# Patient Record
Sex: Female | Born: 1955
Health system: Southern US, Community
[De-identification: ages and names within clinical notes are randomized; demographics above are authoritative.]

## PROBLEM LIST (undated history)

## (undated) DIAGNOSIS — N6320 Unspecified lump in the left breast, unspecified quadrant: Secondary | ICD-10-CM

## (undated) DIAGNOSIS — R011 Cardiac murmur, unspecified: Secondary | ICD-10-CM

## (undated) DIAGNOSIS — E785 Hyperlipidemia, unspecified: Secondary | ICD-10-CM

## (undated) HISTORY — PX: ROTATOR CUFF REPAIR: SHX139

## (undated) HISTORY — DX: Hyperlipidemia, unspecified: E78.5

## (undated) HISTORY — PX: APPENDECTOMY: SHX54

## (undated) HISTORY — PX: COLONOSCOPY: SHX174

---
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
## 1999-12-02 ENCOUNTER — Other Ambulatory Visit: Admission: RE | Admit: 1999-12-02 | Discharge: 1999-12-02 | Payer: Self-pay | Admitting: Family Medicine

## 2001-05-31 ENCOUNTER — Encounter: Payer: Self-pay | Admitting: Family Medicine

## 2001-05-31 ENCOUNTER — Encounter: Admission: RE | Admit: 2001-05-31 | Discharge: 2001-05-31 | Payer: Self-pay | Admitting: Family Medicine

## 2002-01-08 ENCOUNTER — Encounter: Payer: Self-pay | Admitting: Family Medicine

## 2002-01-08 ENCOUNTER — Encounter: Admission: RE | Admit: 2002-01-08 | Discharge: 2002-01-08 | Payer: Self-pay | Admitting: Family Medicine

## 2002-01-10 ENCOUNTER — Inpatient Hospital Stay (HOSPITAL_COMMUNITY): Admission: EM | Admit: 2002-01-10 | Discharge: 2002-01-15 | Payer: Self-pay | Admitting: *Deleted

## 2002-01-11 ENCOUNTER — Encounter: Payer: Self-pay | Admitting: Physical Medicine & Rehabilitation

## 2002-02-25 ENCOUNTER — Encounter: Payer: Self-pay | Admitting: Gastroenterology

## 2002-02-25 ENCOUNTER — Encounter: Admission: RE | Admit: 2002-02-25 | Discharge: 2002-02-25 | Payer: Self-pay | Admitting: Gastroenterology

## 2002-03-08 ENCOUNTER — Encounter: Admission: RE | Admit: 2002-03-08 | Discharge: 2002-03-08 | Payer: Self-pay | Admitting: Gastroenterology

## 2002-03-08 ENCOUNTER — Encounter: Payer: Self-pay | Admitting: Gastroenterology

## 2003-08-07 ENCOUNTER — Encounter: Admission: RE | Admit: 2003-08-07 | Discharge: 2003-08-07 | Payer: Self-pay | Admitting: Family Medicine

## 2004-10-18 ENCOUNTER — Encounter: Admission: RE | Admit: 2004-10-18 | Discharge: 2004-10-18 | Payer: Self-pay | Admitting: Family Medicine

## 2005-07-12 ENCOUNTER — Other Ambulatory Visit: Admission: RE | Admit: 2005-07-12 | Discharge: 2005-07-12 | Payer: Self-pay | Admitting: Family Medicine

## 2005-11-18 ENCOUNTER — Encounter: Admission: RE | Admit: 2005-11-18 | Discharge: 2005-11-18 | Payer: Self-pay | Admitting: Family Medicine

## 2006-11-20 ENCOUNTER — Encounter: Admission: RE | Admit: 2006-11-20 | Discharge: 2006-11-20 | Payer: Self-pay | Admitting: Family Medicine

## 2007-12-28 ENCOUNTER — Encounter: Admission: RE | Admit: 2007-12-28 | Discharge: 2007-12-28 | Payer: Self-pay | Admitting: Family Medicine

## 2009-01-09 ENCOUNTER — Encounter: Admission: RE | Admit: 2009-01-09 | Discharge: 2009-01-09 | Payer: Self-pay | Admitting: Family Medicine

## 2009-11-09 ENCOUNTER — Ambulatory Visit (HOSPITAL_COMMUNITY): Admission: RE | Admit: 2009-11-09 | Discharge: 2009-11-09 | Payer: Self-pay | Admitting: Surgery

## 2009-11-11 ENCOUNTER — Ambulatory Visit (HOSPITAL_COMMUNITY): Admission: RE | Admit: 2009-11-11 | Discharge: 2009-11-11 | Payer: Self-pay | Admitting: Surgery

## 2009-11-23 ENCOUNTER — Ambulatory Visit (HOSPITAL_BASED_OUTPATIENT_CLINIC_OR_DEPARTMENT_OTHER): Admission: RE | Admit: 2009-11-23 | Discharge: 2009-11-23 | Payer: Self-pay | Admitting: Surgery

## 2009-11-28 ENCOUNTER — Ambulatory Visit: Payer: Self-pay | Admitting: Internal Medicine

## 2009-12-08 ENCOUNTER — Encounter: Admission: RE | Admit: 2009-12-08 | Discharge: 2010-03-08 | Payer: Self-pay | Admitting: Surgery

## 2010-01-11 ENCOUNTER — Encounter: Admission: RE | Admit: 2010-01-11 | Discharge: 2010-01-11 | Payer: Self-pay | Admitting: Family Medicine

## 2010-01-18 ENCOUNTER — Ambulatory Visit (HOSPITAL_COMMUNITY): Admission: RE | Admit: 2010-01-18 | Discharge: 2010-01-19 | Payer: Self-pay | Admitting: Surgery

## 2010-01-18 HISTORY — PX: LAPAROSCOPIC GASTRIC BANDING: SHX1100

## 2010-03-17 ENCOUNTER — Encounter: Admission: RE | Admit: 2010-03-17 | Discharge: 2010-06-15 | Payer: Self-pay | Admitting: Surgery

## 2010-05-04 ENCOUNTER — Other Ambulatory Visit: Admission: RE | Admit: 2010-05-04 | Discharge: 2010-05-04 | Payer: Self-pay | Admitting: Family Medicine

## 2010-08-12 ENCOUNTER — Encounter
Admission: RE | Admit: 2010-08-12 | Discharge: 2010-08-12 | Payer: Self-pay | Source: Home / Self Care | Attending: Surgery | Admitting: Surgery

## 2010-12-14 LAB — DIFFERENTIAL
Basophils Relative: 1 % (ref 0–1)
Eosinophils Absolute: 0 10*3/uL (ref 0.0–0.7)
Eosinophils Absolute: 0 10*3/uL (ref 0.0–0.7)
Lymphocytes Relative: 18 % (ref 12–46)
Lymphocytes Relative: 38 % (ref 12–46)
Lymphs Abs: 1.2 10*3/uL (ref 0.7–4.0)
Lymphs Abs: 1.6 10*3/uL (ref 0.7–4.0)
Monocytes Relative: 10 % (ref 3–12)
Neutro Abs: 4.6 10*3/uL (ref 1.7–7.7)
Neutrophils Relative %: 73 % (ref 43–77)

## 2010-12-14 LAB — GLUCOSE, CAPILLARY: Glucose-Capillary: 124 mg/dL — ABNORMAL HIGH (ref 70–99)

## 2010-12-14 LAB — COMPREHENSIVE METABOLIC PANEL
AST: 32 U/L (ref 0–37)
Albumin: 4.3 g/dL (ref 3.5–5.2)
Alkaline Phosphatase: 61 U/L (ref 39–117)
Chloride: 105 mEq/L (ref 96–112)
Creatinine, Ser: 0.79 mg/dL (ref 0.4–1.2)
GFR calc Af Amer: >60 mL/min (ref >60)
GFR calc non Af Amer: >60 mL/min (ref >60)
Glucose, Bld: 160 mg/dL — ABNORMAL HIGH (ref 70–99)
Total Protein: 7.4 g/dL (ref 6.0–8.3)

## 2010-12-14 LAB — CBC
Platelets: 255 10*3/uL (ref 150–400)
RBC: 3.57 MIL/uL — ABNORMAL LOW (ref 3.87–5.11)
RDW: 13.1 % (ref 11.5–15.5)
RDW: 13.1 % (ref 11.5–15.5)
WBC: 4.3 10*3/uL (ref 4.0–10.5)
WBC: 6.3 10*3/uL (ref 4.0–10.5)

## 2010-12-14 LAB — HEMOGLOBIN AND HEMATOCRIT, BLOOD
HCT: 33.8 % — ABNORMAL LOW (ref 36.0–46.0)
Hemoglobin: 10.8 g/dL — ABNORMAL LOW (ref 12.0–15.0)

## 2010-12-16 ENCOUNTER — Other Ambulatory Visit: Payer: Self-pay | Admitting: Family Medicine

## 2010-12-16 DIAGNOSIS — Z1231 Encounter for screening mammogram for malignant neoplasm of breast: Secondary | ICD-10-CM

## 2011-01-12 ENCOUNTER — Encounter: Admit: 2011-01-12 | Payer: Self-pay | Admitting: Surgery

## 2011-01-12 ENCOUNTER — Ambulatory Visit: Payer: Self-pay | Admitting: *Deleted

## 2011-01-13 ENCOUNTER — Ambulatory Visit
Admission: RE | Admit: 2011-01-13 | Discharge: 2011-01-13 | Disposition: A | Discharge status: Home / Self Care | Payer: BC Managed Care – PPO | Source: Ambulatory Visit | Attending: Family Medicine | Admitting: Family Medicine

## 2011-01-13 DIAGNOSIS — Z1231 Encounter for screening mammogram for malignant neoplasm of breast: Secondary | ICD-10-CM

## 2011-02-11 NOTE — Discharge Summary (Signed)
[facility]  Patient:    Kendra Jennings, Kendra Jennings Visit Number: [id number] MRN: [id number]          Service Type: MED Location: [phone] Attending Physician:  Madelyn Flavors Dictated by:   Beather Arbour Thomasena Edis, M.D. Admit Date:  01/10/2002 Discharge Date: 01/15/2002   CC:         Florencia Reasons, M.D.  Jethro Bastos, M.D.   Discharge Summary  CURRENT HISTORY:  The patient is a 55 year old, gravida 0, African-American female who presented to [facility] complaining of left lower quadrant pain. She was subsequently sent to the emergency room for evaluation after seeing Dr. Shaune Pollack on the afternoon of April 17 with findings of increased abdominal pain. She did have a history of an appendectomy for a ruptured appendix at age 77.  HOSPITAL COURSE:  The patient was admitted for evaluation. She subsequently had a GI evaluation by Dr. Bernette Redbird. She was started on antibiotic ciprofloxacin and metronidazole for her pain. She did have a pelvic ultrasound which was negative and an abdominopelvic CT which was in essence negative as well. She states that years ago she did have antibiotics for an "intestinal infection" similar to her current symptoms. Her pelvic CT did show the uterus to be approximately 12 weeks in size consistent with fibroids. On evaluation prior to discharge, her uterus was found to be nontender and she had no right or left adnexal tenderness. The case was discussed with Dr. Matthias Hughs who agrees with me that the diagnosis is unclear. The patient showed improvement and was subsequently discharged home on metronidazole and ciprofloxacin on April 22. Of note, the patient told me that she has never had intercourse and thus it is highly doubtful that she would have a pelvic inflammatory disease in view of no intercourse. The patient was asked to follow-up with Dr. Matthias Hughs on Tuesday, May 27 at 9 a.m. and to call our  office for an appointment for 2-3 weeks. The patient was also instructed to resume her diabetes medications including Amaryl and Actos and her PPI Aciphex as well as her Lotensin and baby aspirin. Dictated by:   Beather Arbour Thomasena Edis, M.D. Attending Physician:  Madelyn Flavors DD:  01/19/02 TD:  01/21/02 Job: [id number]

## 2011-02-11 NOTE — Consult Note (Signed)
[facility]  Jennings:    Kendra Jennings, Kendra Jennings Visit Number: [id number] MRN: [id number]          Service Type: MED Location: [phone] Attending Physician:  Kendra Jennings Dictated by:   Kendra Jennings, M.D. Proc. Date: 01/12/02 Admit Date:  01/10/2002   CC:         Kendra Jennings, M.D.  Kendra Jennings, M.D.   Consultation Report  HISTORY OF PRESENT ILLNESS:  Dr. Eda Jennings, covering for Dr. Artist Jennings, asked me to see this 55 year old African-American female because of persistent lower abdominal pain.  Kendra Jennings is followed primarily by Dr. Woodfin Jennings at [facility] and, approximately five days ago, developed pain in the lower abdominal region which was fairly sharp and twisting in character, not associated with any change in bowel habits.  She was seen by Dr. Dorothe Jennings and a pelvic ultrasound was obtained showing apparently uterine fibroids.  Because of persistent symptoms and perhaps worsening of symptoms, she was seen subsequently by Dr. Kevan Jennings, referred to Dr. Artist Jennings, and ended up being admitted by Dr. Melba Jennings here at [facility] two days ago.  The Jennings apparently had a temperature greater than 100 degrees when she was initially seen at [facility] although in the emergency room on presentation here two days ago, the temperature was 99.4.  The Jennings also developed nausea and vomiting a day or two after the onset of symptoms, although those symptoms have fairly well diminished.  An abdominal pelvic CT was obtained which raised the question of fibroid, perhaps a degenerating fibroid, in the uterus and I have reviewed that CT with the radiologist.  Of note, the Jennings had marked uterine tenderness to examination when the Jennings was admitted by Dr. Randell Jennings two days ago.  The CT scan was carefully evaluated for any evidence of diverticular change or colonic inflammation or pericolonic  inflammation, diverticulitis, inflammatory bowel disease of the colon, etcetera, but there were no findings to suggest any of those diagnoses.  The Jennings is anemic but her Hemoccult status is not currently known.  She has not noticed any rectal bleeding and again has not had any diarrhea in association with the current problem.  The Jennings does not note any clear connection between consumption of food and exacerbation of these symptoms.  She really has not eaten much for the past five days and is currently on a clear liquid diet.  The Jennings has not really had pain similar to this before.  There is no family history of inflammatory bowel disease or other GI tract illnesses.  It has been about two or three days since the patients last bowel movement.  The Jennings has been consistently afebrile and with normal white count since admission.  ALLERGIES:  No known allergies.  CURRENT MEDICATIONS:  Amaryl, Actos, daily baby aspirin, Aciphex, and Lotensin.  PAST SURGICAL HISTORY: 1. Appendectomy at age 43. 2. D&C for menorrhagia about 20 years ago. 3. Exploratory surgery on her neck about 15 years ago.  MEDICAL ILLNESSES: 1. Seven year history of type 2 diabetes. 2. History of hypertension. 3. No chronic obstructive pulmonary disease. 4. No known coronary disease. 5. There is a history of gastroesophageal reflux disease controlled on    medication.  HABITS:  Nonsmoker, nondrinker.  FAMILY HISTORY:  Possible liver or kidney cancer in her mother but negative for colon cancer, inflammatory bowel disease or colitis, gallbladder trouble, liver disease, or ulcers.  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  She last worked part of Tuesday, about four days ago.  REVIEW OF SYSTEMS:  The Jennings has a history of acid reflux symptoms for which she is maintained on Aciphex with pretty  good control.  No problem with esophageal dysphagia symptoms, anorexia, involuntary weight loss, chronic nausea, vomiting or abdominal pain, constipation, diarrhea, or rectal bleeding.  PHYSICAL EXAMINATION:  GENERAL:  Kendra Jennings is an overweight, pleasant African-American female in perhaps mild discomfort or distress but certainly not in acute active distress.  HEENT:  She is anicteric and without conjunctival pallor.  CHEST:  Clear.  HEART:  Normal.  ABDOMEN:  Obese and rather firm, without discreet tenderness, mass effect, or palpable organomegaly.  There is no impressive tenderness and certainly there are peritoneal findings.  RECTAL:  Examination showed an empty rectal ampulla with mucoid residue which was applied to a guaiac card which is being sent down to the laboratory for analysis.  NEUROLOGIC:  She appears grossly intact, without obvious cranial nerve or focal motor deficits.  LABORATORY DATA:  Urine pregnancy test negative.  GC and Chlamydia probes and RPR are negative.  Wet prep is negative.  Hemoglobin A1c is 8.  Admission blood work pertinent for potassium of 3.1, glucose of 262, and completely normal liver chemistries with an albumin of 3.6.  White count 6500, hemoglobin 10.3, with an MCV of 79 and a normal RDW of 14.2.  CT SCAN:  Showed evidence for fatty infiltration of the liver and uterine fibroids, but no other significant or acute abnormalities were identified.  ADDENDUM:  The fecal occult blood test on the digital rectal exam specimen is now available and has come back negative.  IMPRESSION: 1. Low abdominal pain of unclear etiology, gynecologic versus    gastrointestinal. 2. Borderline microcytic anemia with normal RDW. 3. Recent nausea and vomiting. 4. Radiographic evidence for fatty infiltration of the liver, but with normal     liver chemistries. 5. Uterine fibroids. 6. Type 2 diabetes. 7. Gastroesophageal reflux disease, medically  controlled.  DISCUSSION:  The absence of intestinal symptoms such as diarrhea, the absence of radiographic abnormalities of intestinal structures in the left lower quadrant such as the sigmoid colon, and the absence of occult blood in the stool would all argue against this being a gastrointestinal process. Conceivably, this could be a very low grade localized diverticulitis, especially considering the fact that the Jennings started out with a low grade fever, and for that reason, I think the initiation of some empiric antibiotic therapy is reasonable.  I will also obtain iron studies and a sed rate.  If the Jennings is shown to be iron deficient, especially if subsequent stool guaiacs come back positive, we might consider colonoscopic or at least sigmoidoscopic evaluation but on balance, I think this will turn out to be something other than the gastrointestinal process accounting for the patients acute illness.  I appreciate the opportunity to have seen this Jennings in consultation with you and will continue to follow her with you for at least the next couple of days. Dictated by:   Kendra Jennings, M.D. Attending Physician:  Kendra Jennings DD:  01/12/02 TD:  01/12/02 Job: [id number]

## 2011-02-11 NOTE — H&P (Signed)
[facility]  Patient:    Kendra Jennings, Kendra Jennings Visit Number: [id number] MRN: [id number]          Service Type: MED Location: [phone] Attending Physician:  Madelyn Flavors Dictated by:   Almedia Balls Randell Patient, M.D. Admit Date:  01/10/2002   CC:         Lafonda Mosses B. Thomasena Edis, M.D.  Duncan Dull, M.D.   History and Physical  CHIEF COMPLAINT:  Pain.  HISTORY OF PRESENT ILLNESS:  The patient is a 55 year old gravida 0, para 0, whose last menstrual period was December 28, 2001.  She states that her menses have been regular and without problems.  She began having left lower quadrant pain on January 07, 2002, and presented to [facility] where she was seen by Dr. Dorothe Pea who obtained an ultrasound which showed uterine enlargement consistent with fibroid uterus and normal ovaries bilaterally. There was noted to be no paraovarian or cul-de-sac fluid at this time.  This was performed on January 08, 2002.  She states that over the past several days she has had increased nausea and vomiting and pain increasing on the afternoon of January 10, 2002.  She was evaluated again at [facility] by Dr. Shaune Pollack on the afternoon on January 10, 2002, with findings of increased abdominal pain.  The patients vital signs were otherwise normal.  CBC was obtained which showed white count of 8400 ______ with some shift in leukocytes with 72+ percent segmented neutrophils present.  Hemoglobin was 11.7, hematocrit 37.1, with MCV at 79, MCH 24.9, which are low.  CMET panel was performed as well which showed serum glucose of 326, and the patient is a non-insulin-dependent diabetic, being maintained on Amaryl and Actos.  The remainder of the CMET panel was normal.  She was referred to [facility], Dr. Artist Pais, for whom I am covering on this date.  She is admitted through the emergency room at this time for further evaluation.  PAST MEDICAL HISTORY:   Appendectomy for ruptured appendix at age 81.  No other surgery.  She states that she has not had sexual exposure in some time.  She denies any discharge, prior pelvic infection, GI, or GU problems.  ALLERGIES:  She is allergic to no medications.  MEDICATIONS:  Amaryl 2 mg a day for her diabetes, as well as Actos 30 mg a day.  She takes Aciphex for "GERD" 1 per day, and Lotensin 150 mg per day for hypertension.  She also takes one aspirin tablet per day.  REVIEW OF SYSTEMS:  HEENT:  Wears glasses.  CARDIORESPIRATORY:  Negative. GASTROINTESTINAL:  As noted in present illness.  GENITOURINARY:  As noted above.  NEUROMUSCULAR:  Negative.  PHYSICAL EXAMINATION:  VITAL SIGNS:  Blood pressure 148/92, pulse 96, respirations 18.  The patient is afebrile on admission.  GENERAL:  Well-developed black female in moderate distress.  HEENT:  Within normal limits.  NECK:  Supple.  Without masses, adenopathy, or bruits.  HEART:  Elevated heart rate which is tachycardic at times.  Without definite murmur present.  LUNGS:  Clear to P&A.  ABDOMEN:  Soft, with increased panniculus.  Tender in the left lower quadrant, without rebound.  Bowel sounds are somewhat diminished but are present.  PELVIC:  External genitalia, Bartholin, urethral, and Skenes glands within normal limits.  Cervix has very minimal mucopurulent discharge present.  The os is closed.  Bimanual exam reveals exquisite tenderness on palpation of the uterus which is enlarged  to approximately [redacted] weeks gestational size and is somewhat irregular.  There are no palpable adnexal masses, and both adnexa are only minimally tender when palpated deeply.  Anterior and posterior cul-de-sac exam is confirmatory.  EXTREMITIES:   Within normal limits.  CENTRAL NERVOUS SYSTEM:  Grossly intact.  SKIN:  Without suspicious lesions.  IMPRESSION:  Left lower quadrant to mid lower abdomen pain.  PLAN:  Admit for IVs and further  evaluation. Dictated by:   Almedia Balls Randell Patient, M.D. Attending Physician:  Madelyn Flavors DD:  01/10/02 TD:  01/11/02 Job: [phone]

## 2011-04-29 ENCOUNTER — Encounter (INDEPENDENT_AMBULATORY_CARE_PROVIDER_SITE_OTHER): Payer: Self-pay

## 2011-04-29 ENCOUNTER — Ambulatory Visit (INDEPENDENT_AMBULATORY_CARE_PROVIDER_SITE_OTHER): Payer: BC Managed Care – PPO | Admitting: Physician Assistant

## 2011-04-29 VITALS — BP 120/84 | Ht 59.0 in | Wt 153.0 lb

## 2011-04-29 DIAGNOSIS — Z4651 Encounter for fitting and adjustment of gastric lap band: Secondary | ICD-10-CM

## 2011-04-29 NOTE — Patient Instructions (Signed)
Take clear liquids for the next 48 hours. Thin protein shakes are ok to start on Saturday evening. Call us if you have persistent vomiting or regurgitation, night cough or reflux symptoms. Return as scheduled or sooner if you notice no changes in hunger/portion sizes.   

## 2011-04-29 NOTE — Progress Notes (Signed)
  HISTORY: Kendra Jennings is a 55 y.o.female who received an AP-Standard lap-band in April 2011 by Dr. Daphine Deutscher. She comes in complaining of persistent hunger and weight increase. She has been increasing her exercise but says her hunger and portions have gone up as well. No vomiting or regurgitation.  VITAL SIGNS: Filed Vitals:   04/29/11 1553  BP: 120/84    PHYSICAL EXAM: Physical exam reveals a very well-appearing 55 y.o.female in no apparent distress Neurologic: Awake, alert, oriented Psych: Bright affect, conversant Respiratory: Breathing even and unlabored. No stridor or wheezing Abdomen: Soft, nontender, nondistended to palpation. Incisions well-healed. No incisional hernias. Port easily palpated. Extremities: Atraumatic, good range of motion.  ASSESMENT: 55 y.o.  female  s/p AP-Standard lap-band.   PLAN: I accessed her port to determine total fill volume which was ca. 5.5 mL. I added 0.78mL to give a total volume of 6 mL after which she was able to swallow water without difficulty. We'll have her back in 3 months or sooner if necessary.

## 2011-08-26 ENCOUNTER — Encounter (INDEPENDENT_AMBULATORY_CARE_PROVIDER_SITE_OTHER): Payer: BC Managed Care – PPO

## 2011-11-24 ENCOUNTER — Encounter (INDEPENDENT_AMBULATORY_CARE_PROVIDER_SITE_OTHER): Payer: Self-pay

## 2011-11-24 ENCOUNTER — Ambulatory Visit (INDEPENDENT_AMBULATORY_CARE_PROVIDER_SITE_OTHER): Payer: BC Managed Care – PPO | Admitting: Physician Assistant

## 2011-11-24 VITALS — BP 114/86 | HR 68 | Temp 97.9°F | Resp 18 | Ht 59.0 in | Wt 151.2 lb

## 2011-11-24 DIAGNOSIS — Z4651 Encounter for fitting and adjustment of gastric lap band: Secondary | ICD-10-CM

## 2011-11-24 NOTE — Patient Instructions (Signed)
Take clear liquids tonight. Thin protein shakes are ok to start tomorrow morning. Slowly advance your diet thereafter. Call us if you have persistent vomiting or regurgitation, night cough or reflux symptoms. Return as scheduled or sooner if you notice no changes in hunger/portion sizes.  

## 2011-11-24 NOTE — Progress Notes (Signed)
  HISTORY: Kendra Jennings is a 56 y.o.female who received an AP-Standard lap-band in April 2011 by Dr. Daphine Deutscher. She comes in today with complaints of increased hunger and portion sizes. She denies regurgitation or reflux. She'd like a fill today.  VITAL SIGNS: Filed Vitals:   11/24/11 1126  BP: 114/86  Pulse: 68  Temp: 97.9 F (36.6 C)  Resp: 18    PHYSICAL EXAM: Physical exam reveals a very well-appearing 56 y.o.female in no apparent distress Neurologic: Awake, alert, oriented Psych: Bright affect, conversant Respiratory: Breathing even and unlabored. No stridor or wheezing Abdomen: Soft, nontender, nondistended to palpation. Incisions well-healed. No incisional hernias. Port easily palpated. Extremities: Atraumatic, good range of motion.  ASSESMENT: 56 y.o.  female  s/p AP-Standard lap-band.   PLAN: The patient's port was accessed with a 20G Huber needle without difficulty. Clear fluid was aspirated and 0.5 mL saline was added to the port to give a total predicted volume of 6.5 mL. The patient was able to swallow water without difficulty following the procedure and was instructed to take clear liquids for the next 24-48 hours and advance slowly as tolerated.

## 2011-12-06 ENCOUNTER — Other Ambulatory Visit: Payer: Self-pay | Admitting: Family Medicine

## 2011-12-06 DIAGNOSIS — Z1231 Encounter for screening mammogram for malignant neoplasm of breast: Secondary | ICD-10-CM

## 2011-12-22 ENCOUNTER — Encounter (INDEPENDENT_AMBULATORY_CARE_PROVIDER_SITE_OTHER): Payer: BC Managed Care – PPO

## 2012-01-16 ENCOUNTER — Ambulatory Visit
Admission: RE | Admit: 2012-01-16 | Discharge: 2012-01-16 | Disposition: A | Discharge status: Home / Self Care | Payer: BC Managed Care – PPO | Source: Ambulatory Visit | Attending: Family Medicine | Admitting: Family Medicine

## 2012-01-16 DIAGNOSIS — Z1231 Encounter for screening mammogram for malignant neoplasm of breast: Secondary | ICD-10-CM

## 2012-02-16 ENCOUNTER — Encounter (INDEPENDENT_AMBULATORY_CARE_PROVIDER_SITE_OTHER): Payer: BC Managed Care – PPO

## 2012-04-26 ENCOUNTER — Encounter (INDEPENDENT_AMBULATORY_CARE_PROVIDER_SITE_OTHER): Payer: Self-pay

## 2012-04-26 ENCOUNTER — Ambulatory Visit (INDEPENDENT_AMBULATORY_CARE_PROVIDER_SITE_OTHER): Payer: BC Managed Care – PPO | Admitting: Physician Assistant

## 2012-04-26 VITALS — BP 132/86 | HR 72 | Temp 98.4°F | Resp 14 | Ht 59.0 in | Wt 158.8 lb

## 2012-04-26 DIAGNOSIS — Z4651 Encounter for fitting and adjustment of gastric lap band: Secondary | ICD-10-CM

## 2012-04-26 NOTE — Patient Instructions (Signed)
Take clear liquids tonight. Thin protein shakes are ok to start tomorrow morning. Slowly advance your diet thereafter. Call us if you have persistent vomiting or regurgitation, night cough or reflux symptoms. Return as scheduled or sooner if you notice no changes in hunger/portion sizes.  

## 2012-04-26 NOTE — Progress Notes (Signed)
  HISTORY: Kendra Jennings is a 56 y.o.female who received an AP-Standard lap-band in April 2011 by Dr. Daphine Deutscher. She comes in with persistent hunger and large portion sizes despite a fill in February. She said she felt little change after that adjustment. She's consistently gaining weight despite an aggressive exercise program. She does admit to eating carbohydrates which may be contributing to her weight gain.  VITAL SIGNS: Filed Vitals:   04/26/12 1158  BP: 132/86  Pulse: 72  Temp: 98.4 F (36.9 C)  Resp: 14    PHYSICAL EXAM: Physical exam reveals a very well-appearing 56 y.o.female in no apparent distress Neurologic: Awake, alert, oriented Psych: Bright affect, conversant Respiratory: Breathing even and unlabored. No stridor or wheezing Abdomen: Soft, nontender, nondistended to palpation. Incisions well-healed. No incisional hernias. Port easily palpated. Extremities: Atraumatic, good range of motion.  ASSESMENT: 56 y.o.  female  s/p AP-Standard lap-band.   PLAN: The patient's port was accessed with a 20G Huber needle without difficulty. Clear fluid was aspirated and 1 mL saline was added to the port to give a total predicted volume of 7.5 mL. The patient was able to swallow water without difficulty following the procedure and was instructed to take clear liquids for the next 24-48 hours and advance slowly as tolerated.

## 2012-08-02 ENCOUNTER — Encounter (INDEPENDENT_AMBULATORY_CARE_PROVIDER_SITE_OTHER): Payer: BC Managed Care – PPO

## 2012-08-09 ENCOUNTER — Other Ambulatory Visit (INDEPENDENT_AMBULATORY_CARE_PROVIDER_SITE_OTHER): Payer: Self-pay | Admitting: Physician Assistant

## 2012-08-09 ENCOUNTER — Ambulatory Visit (INDEPENDENT_AMBULATORY_CARE_PROVIDER_SITE_OTHER): Payer: BC Managed Care – PPO | Admitting: Physician Assistant

## 2012-08-09 ENCOUNTER — Encounter (INDEPENDENT_AMBULATORY_CARE_PROVIDER_SITE_OTHER): Payer: Self-pay

## 2012-08-09 DIAGNOSIS — Z9884 Bariatric surgery status: Secondary | ICD-10-CM

## 2012-08-09 NOTE — Progress Notes (Signed)
  HISTORY: Kendra Jennings is a 56 y.o.female who received an AP-Standard lap-band in April 2011 by Dr. Daphine Deutscher. She comes in with a small amount of weight loss since her last appointment in August. She had a 1 mL fill to give 7.5 mL total. She denies regurgitation or reflux. She's exercising more than four days a week. Her food choices appear to be rather high in carbohydrates which may be contributing to her weight loss stagnation. She hasn't seen a nutritionist in quite a while.  VITAL SIGNS: Filed Vitals:   08/09/12 1516  BP: 126/80  Pulse: 95  Temp: 98 F (36.7 C)  Resp: 18    PHYSICAL EXAM: Physical exam reveals a very well-appearing 56 y.o.female in no apparent distress Neurologic: Awake, alert, oriented Psych: Bright affect, conversant Respiratory: Breathing even and unlabored. No stridor or wheezing Extremities: Atraumatic, good range of motion. Skin: Warm, Dry, no rashes Musculoskeletal: Normal gait, Joints normal  ASSESMENT: 56 y.o.  female  s/p AP-Standard lap-band.   PLAN: I think the band is doing its job at present. 7.5 mL is a large volume for a standard band. As she's exercising regularly and is rather regimented about her eating habits, I think she'd benefit from a visit with Huntley Dec in Nutrition. I'd like to see her after her nutrition appointment.

## 2012-08-09 NOTE — Patient Instructions (Signed)
Return in one month. Focus on good food choices as well as physical activity. Return sooner if you have an increase in hunger, portion sizes or weight. Return also for difficulty swallowing, night cough, reflux.   

## 2012-09-13 ENCOUNTER — Encounter (INDEPENDENT_AMBULATORY_CARE_PROVIDER_SITE_OTHER): Payer: BC Managed Care – PPO

## 2012-10-03 NOTE — Progress Notes (Signed)
Pt not seen by me

## 2012-12-21 ENCOUNTER — Other Ambulatory Visit: Payer: Self-pay | Admitting: Family Medicine

## 2013-01-04 ENCOUNTER — Ambulatory Visit
Admission: RE | Admit: 2013-01-04 | Discharge: 2013-01-04 | Disposition: A | Discharge status: Home / Self Care | Payer: BC Managed Care – PPO | Source: Ambulatory Visit | Attending: Family Medicine | Admitting: Family Medicine

## 2013-01-04 DIAGNOSIS — R7989 Other specified abnormal findings of blood chemistry: Secondary | ICD-10-CM

## 2013-02-04 ENCOUNTER — Other Ambulatory Visit: Payer: Self-pay

## 2013-02-04 DIAGNOSIS — Z1231 Encounter for screening mammogram for malignant neoplasm of breast: Secondary | ICD-10-CM

## 2013-03-12 ENCOUNTER — Ambulatory Visit
Admission: RE | Admit: 2013-03-12 | Discharge: 2013-03-12 | Disposition: A | Discharge status: Home / Self Care | Payer: BC Managed Care – PPO | Source: Ambulatory Visit

## 2013-03-12 DIAGNOSIS — Z1231 Encounter for screening mammogram for malignant neoplasm of breast: Secondary | ICD-10-CM

## 2013-05-30 ENCOUNTER — Ambulatory Visit (INDEPENDENT_AMBULATORY_CARE_PROVIDER_SITE_OTHER): Payer: BC Managed Care – PPO | Admitting: Physician Assistant

## 2013-05-30 ENCOUNTER — Encounter (INDEPENDENT_AMBULATORY_CARE_PROVIDER_SITE_OTHER): Payer: Self-pay

## 2013-05-30 VITALS — BP 120/86 | HR 78 | Resp 16 | Ht 59.0 in | Wt 156.6 lb

## 2013-05-30 DIAGNOSIS — Z4651 Encounter for fitting and adjustment of gastric lap band: Secondary | ICD-10-CM

## 2013-05-30 NOTE — Progress Notes (Signed)
  HISTORY: Kendra Jennings is a 57 y.o.female who received an AP-Standard lap-band in April 2011 by Dr. Daphine Deutscher. She comes in with stable weight since her last visit but she's complaining of increasing hunger and larger portions. She's increased her exercise but is concerned that little has changed with regard to her weight or overall size. 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 She denies regurgitation, reflux or night cough. She would like an adjustment today to help continue losing weight.  VITAL SIGNS: Filed Vitals:   05/30/13 1430  BP: 120/86  Pulse: 78  Resp: 16    PHYSICAL EXAM: Physical exam reveals a very well-appearing 57 y.o.female in no apparent distress Neurologic: Awake, alert, oriented Psych: Bright affect, conversant Respiratory: Breathing even and unlabored. No stridor or wheezing Abdomen: Soft, nontender, nondistended to palpation. Incisions well-healed. No incisional hernias. Port easily palpated. Extremities: Atraumatic, good range of motion.  ASSESMENT: 57 y.o.  female  s/p AP-Standard lap-band.   PLAN: The patient's port was accessed with a 20G Huber needle without difficulty. Clear fluid was aspirated and 0.25 mL saline was added to the port to give a total predicted volume of 7.75 mL. The patient was able to swallow water without difficulty following the procedure and was instructed to take clear liquids for the next 24-48 hours and advance slowly as tolerated.

## 2013-05-30 NOTE — Patient Instructions (Signed)

## 2013-08-29 ENCOUNTER — Encounter (INDEPENDENT_AMBULATORY_CARE_PROVIDER_SITE_OTHER): Payer: BC Managed Care – PPO

## 2013-10-03 ENCOUNTER — Encounter (INDEPENDENT_AMBULATORY_CARE_PROVIDER_SITE_OTHER): Payer: Self-pay

## 2013-10-03 ENCOUNTER — Ambulatory Visit (INDEPENDENT_AMBULATORY_CARE_PROVIDER_SITE_OTHER): Payer: BC Managed Care – PPO | Admitting: Physician Assistant

## 2013-10-03 VITALS — BP 140/90 | HR 96 | Temp 98.5°F | Resp 14 | Ht 59.0 in | Wt 156.4 lb

## 2013-10-03 DIAGNOSIS — Z4651 Encounter for fitting and adjustment of gastric lap band: Secondary | ICD-10-CM

## 2013-10-03 NOTE — Progress Notes (Signed)
  HISTORY: Kendra Jennings is a 58 y.o.female who received an AP-Standard lap-band in April 2011 by Dr. Daphine DeutscherMartin. She comes in with stable weight since her last visit in September. She complains of increasing hunger and portions recently but no regurgitation or reflux. She is exercising regularly.  VITAL SIGNS: Filed Vitals:   10/03/13 1234  BP: 140/90  Pulse: 96  Temp: 98.5 F (36.9 C)  Resp: 14    PHYSICAL EXAM: Physical exam reveals a very well-appearing 58 y.o.female in no apparent distress Neurologic: Awake, alert, oriented Psych: Bright affect, conversant Respiratory: Breathing even and unlabored. No stridor or wheezing Abdomen: Soft, nontender, nondistended to palpation. Incisions well-healed. No incisional hernias. Port easily palpated. Extremities: Atraumatic, good range of motion.  ASSESMENT: 58 y.o.  female  s/p AP-Standard lap-band.   PLAN: The patient's port was accessed with a 20G Huber needle without difficulty. Clear fluid was aspirated and 0.2 mL saline was added to the port to give a total predicted volume of 7.95 mL. The patient was able to swallow water without difficulty following the procedure and was instructed to take clear liquids for the next 24-48 hours and advance slowly as tolerated.

## 2013-10-03 NOTE — Patient Instructions (Signed)

## 2013-12-05 ENCOUNTER — Other Ambulatory Visit (HOSPITAL_COMMUNITY)
Admission: RE | Admit: 2013-12-05 | Discharge: 2013-12-05 | Disposition: A | Discharge status: Home / Self Care | Payer: BC Managed Care – PPO | Source: Ambulatory Visit | Attending: Family Medicine | Admitting: Family Medicine

## 2013-12-05 ENCOUNTER — Other Ambulatory Visit: Payer: Self-pay | Admitting: Family Medicine

## 2013-12-05 DIAGNOSIS — Z124 Encounter for screening for malignant neoplasm of cervix: Secondary | ICD-10-CM | POA: Insufficient documentation

## 2013-12-05 DIAGNOSIS — Z1151 Encounter for screening for human papillomavirus (HPV): Secondary | ICD-10-CM | POA: Insufficient documentation

## 2014-02-14 ENCOUNTER — Other Ambulatory Visit: Payer: Self-pay | Admitting: Gastroenterology

## 2014-04-11 ENCOUNTER — Other Ambulatory Visit: Payer: Self-pay

## 2014-04-11 DIAGNOSIS — Z1231 Encounter for screening mammogram for malignant neoplasm of breast: Secondary | ICD-10-CM

## 2014-04-28 ENCOUNTER — Ambulatory Visit
Admission: RE | Admit: 2014-04-28 | Discharge: 2014-04-28 | Disposition: A | Discharge status: Home / Self Care | Payer: BC Managed Care – PPO | Source: Ambulatory Visit

## 2014-04-28 DIAGNOSIS — Z1231 Encounter for screening mammogram for malignant neoplasm of breast: Secondary | ICD-10-CM

## 2014-04-29 ENCOUNTER — Other Ambulatory Visit: Payer: Self-pay | Admitting: Family Medicine

## 2014-04-29 DIAGNOSIS — R928 Other abnormal and inconclusive findings on diagnostic imaging of breast: Secondary | ICD-10-CM

## 2014-05-06 ENCOUNTER — Ambulatory Visit
Admission: RE | Admit: 2014-05-06 | Discharge: 2014-05-06 | Disposition: A | Discharge status: Home / Self Care | Payer: BC Managed Care – PPO | Source: Ambulatory Visit | Attending: Family Medicine | Admitting: Family Medicine

## 2014-05-06 DIAGNOSIS — R928 Other abnormal and inconclusive findings on diagnostic imaging of breast: Secondary | ICD-10-CM

## 2014-05-15 ENCOUNTER — Ambulatory Visit (INDEPENDENT_AMBULATORY_CARE_PROVIDER_SITE_OTHER): Payer: BC Managed Care – PPO | Admitting: Physician Assistant

## 2014-05-15 ENCOUNTER — Encounter (INDEPENDENT_AMBULATORY_CARE_PROVIDER_SITE_OTHER): Payer: Self-pay

## 2014-05-15 VITALS — BP 136/80 | HR 89 | Ht 59.0 in | Wt 158.8 lb

## 2014-05-15 DIAGNOSIS — Z4651 Encounter for fitting and adjustment of gastric lap band: Secondary | ICD-10-CM

## 2014-05-15 NOTE — Patient Instructions (Signed)

## 2014-05-15 NOTE — Progress Notes (Signed)
  HISTORY: Kendra Jennings is a 58 y.o.female who received an AP-Standard lap-band in April 2011 by Dr. Daphine DeutscherMartin. The patient has gained 2.4 lbs since their last visit in January, and has lost 27 lbs since surgery. She is exercising five days a week and has noticed her clothes fitting more loosely but her weight hasn't changed considerably despite her appetite increasing. She also reports eating more, both with regard to portion size and frequency. She denies any regurgitation or reflux whatsoever. She is enrolled in the [facility] program with [employer].  VITAL SIGNS: Filed Vitals:   05/15/14 0917  BP: 136/80  Pulse: 89    PHYSICAL EXAM: Physical exam reveals a very well-appearing 58 y.o.female in no apparent distress Neurologic: Awake, alert, oriented Psych: Bright affect, conversant Respiratory: Breathing even and unlabored. No stridor or wheezing Abdomen: Soft, nontender, nondistended to palpation. Incisions well-healed. No incisional hernias. Port easily palpated. Extremities: Atraumatic, good range of motion.  ASSESMENT: 58 y.o.  female  s/p AP-Standard lap-band.   PLAN: The patient's port was accessed with a 20G Huber needle without difficulty. Clear fluid was aspirated and 0.25 mL saline was added to the port to give a total predicted volume of 8.2 mL. The patient was able to swallow water without difficulty following the procedure and was instructed to take clear liquids for the next 24-48 hours and advance slowly as tolerated.

## 2015-04-27 ENCOUNTER — Other Ambulatory Visit: Payer: Self-pay

## 2015-04-27 DIAGNOSIS — Z1231 Encounter for screening mammogram for malignant neoplasm of breast: Secondary | ICD-10-CM

## 2015-05-12 ENCOUNTER — Ambulatory Visit
Admission: RE | Admit: 2015-05-12 | Discharge: 2015-05-12 | Disposition: A | Discharge status: Home / Self Care | Payer: BC Managed Care – PPO | Source: Ambulatory Visit

## 2015-05-12 DIAGNOSIS — Z1231 Encounter for screening mammogram for malignant neoplasm of breast: Secondary | ICD-10-CM

## 2016-04-27 ENCOUNTER — Other Ambulatory Visit: Payer: Self-pay | Admitting: Family Medicine

## 2016-04-27 DIAGNOSIS — Z1231 Encounter for screening mammogram for malignant neoplasm of breast: Secondary | ICD-10-CM

## 2016-05-13 ENCOUNTER — Ambulatory Visit
Admission: RE | Admit: 2016-05-13 | Discharge: 2016-05-13 | Disposition: A | Discharge status: Home / Self Care | Payer: BC Managed Care – PPO | Source: Ambulatory Visit | Attending: Family Medicine | Admitting: Family Medicine

## 2016-05-13 DIAGNOSIS — Z1231 Encounter for screening mammogram for malignant neoplasm of breast: Secondary | ICD-10-CM

## 2016-07-22 ENCOUNTER — Other Ambulatory Visit: Payer: Self-pay | Admitting: Orthopaedic Surgery

## 2016-07-22 DIAGNOSIS — M25511 Pain in right shoulder: Secondary | ICD-10-CM

## 2016-08-02 ENCOUNTER — Ambulatory Visit
Admission: RE | Admit: 2016-08-02 | Discharge: 2016-08-02 | Disposition: A | Discharge status: Home / Self Care | Payer: BC Managed Care – PPO | Source: Ambulatory Visit | Attending: Orthopaedic Surgery | Admitting: Orthopaedic Surgery

## 2016-08-02 DIAGNOSIS — M25511 Pain in right shoulder: Secondary | ICD-10-CM

## 2016-08-14 ENCOUNTER — Ambulatory Visit: Admission: RE | Admit: 2016-08-14 | Payer: BC Managed Care – PPO | Source: Ambulatory Visit

## 2016-08-23 ENCOUNTER — Encounter (HOSPITAL_COMMUNITY): Payer: Self-pay

## 2016-08-25 ENCOUNTER — Other Ambulatory Visit (HOSPITAL_COMMUNITY): Payer: Self-pay | Admitting: Orthopaedic Surgery

## 2016-08-25 DIAGNOSIS — M25511 Pain in right shoulder: Secondary | ICD-10-CM

## 2016-09-05 ENCOUNTER — Encounter (HOSPITAL_COMMUNITY): Payer: Self-pay | Admitting: General Practice

## 2016-09-05 NOTE — Progress Notes (Signed)
PCP - Dr. Boris LownKiorala Cardiologist - denies Endocrinologist - Dr. Talmage NapBalan  EKG - DOS CXR - denies Echo/stress test/cardiac cath - pt. Denies  Patient denies chest pain and shortness of breath at PAT appointment.    Patient states that she checks her blood sugar approximately once a week and that her fasting glucose is 152.  Patient informed that she needs to check her blood sugar morning of procedure and that if it is less than 70, then she needs to drink 1/2 cup of apple or cranberry juice and recheck blood sugar 15 minutes after treatment.  Patient verbalized understanding.    Per diabetic protocol, patient will not take any diabetic medications the morning of surgery.

## 2016-09-06 ENCOUNTER — Ambulatory Visit (HOSPITAL_COMMUNITY): Payer: BC Managed Care – PPO | Admitting: Certified Registered"

## 2016-09-06 ENCOUNTER — Encounter (HOSPITAL_COMMUNITY): Payer: Self-pay | Admitting: Certified Registered"

## 2016-09-06 ENCOUNTER — Ambulatory Visit (HOSPITAL_COMMUNITY): Payer: BC Managed Care – PPO

## 2016-09-06 ENCOUNTER — Encounter (HOSPITAL_COMMUNITY): Payer: Self-pay

## 2016-09-06 ENCOUNTER — Encounter (HOSPITAL_COMMUNITY)
Admission: RE | Disposition: A | Discharge status: Home / Self Care | Payer: Self-pay | Source: Ambulatory Visit | Attending: Orthopaedic Surgery

## 2016-09-06 ENCOUNTER — Ambulatory Visit (HOSPITAL_COMMUNITY)
Admission: RE | Admit: 2016-09-06 | Discharge: 2016-09-06 | Disposition: A | Discharge status: Home / Self Care | Payer: BC Managed Care – PPO | Source: Ambulatory Visit | Attending: Orthopaedic Surgery | Admitting: Orthopaedic Surgery

## 2016-09-06 DIAGNOSIS — E119 Type 2 diabetes mellitus without complications: Secondary | ICD-10-CM | POA: Insufficient documentation

## 2016-09-06 DIAGNOSIS — Z7984 Long term (current) use of oral hypoglycemic drugs: Secondary | ICD-10-CM | POA: Diagnosis not present

## 2016-09-06 DIAGNOSIS — M25511 Pain in right shoulder: Secondary | ICD-10-CM | POA: Insufficient documentation

## 2016-09-06 DIAGNOSIS — Z7982 Long term (current) use of aspirin: Secondary | ICD-10-CM | POA: Diagnosis not present

## 2016-09-06 DIAGNOSIS — M7551 Bursitis of right shoulder: Secondary | ICD-10-CM | POA: Diagnosis not present

## 2016-09-06 DIAGNOSIS — M7521 Bicipital tendinitis, right shoulder: Secondary | ICD-10-CM | POA: Insufficient documentation

## 2016-09-06 HISTORY — DX: Cardiac murmur, unspecified: R01.1

## 2016-09-06 HISTORY — PX: RADIOLOGY WITH ANESTHESIA: SHX6223

## 2016-09-06 LAB — BASIC METABOLIC PANEL
Anion gap: 11 (ref 5–15)
BUN: 6 mg/dL (ref 6–20)
CHLORIDE: 107 mmol/L (ref 101–111)
CO2: 22 mmol/L (ref 22–32)
CREATININE: 0.72 mg/dL (ref 0.44–1.00)
Calcium: 9.4 mg/dL (ref 8.9–10.3)
GFR calc non Af Amer: >60 mL/min (ref >60)
Glucose, Bld: 240 mg/dL — ABNORMAL HIGH (ref 65–99)
POTASSIUM: 3.5 mmol/L (ref 3.5–5.1)
SODIUM: 140 mmol/L (ref 135–145)

## 2016-09-06 LAB — CBC
HCT: 41.9 % (ref 36.0–46.0)
HEMOGLOBIN: 13.8 g/dL (ref 12.0–15.0)
MCH: 28.8 pg (ref 26.0–34.0)
MCHC: 32.9 g/dL (ref 30.0–36.0)
MCV: 87.3 fL (ref 78.0–100.0)
Platelets: 298 10*3/uL (ref 150–400)
RBC: 4.8 MIL/uL (ref 3.87–5.11)
RDW: 12.1 % (ref 11.5–15.5)
WBC: 4.6 10*3/uL (ref 4.0–10.5)

## 2016-09-06 LAB — GLUCOSE, CAPILLARY: GLUCOSE-CAPILLARY: 188 mg/dL — AB (ref 65–99)

## 2016-09-06 SURGERY — RADIOLOGY WITH ANESTHESIA
Anesthesia: General

## 2016-09-06 MED ORDER — TRAMADOL HCL 50 MG PO TABS
ORAL_TABLET | ORAL | Status: AC
  Filled 2016-09-06: qty 1

## 2016-09-06 MED ORDER — PROMETHAZINE HCL 25 MG/ML IJ SOLN: 6.2500 mg | INTRAMUSCULAR | Status: DC | PRN

## 2016-09-06 MED ORDER — TRAMADOL HCL 50 MG PO TABS
50.0000 mg | ORAL_TABLET | Freq: Once | ORAL | Status: AC
  Administered 2016-09-06: 50 mg via ORAL

## 2016-09-06 MED ORDER — FENTANYL CITRATE (PF) 100 MCG/2ML IJ SOLN
25.0000 ug | INTRAMUSCULAR | Status: DC | PRN
  Administered 2016-09-06: 50 ug via INTRAVENOUS

## 2016-09-06 MED ORDER — LACTATED RINGERS IV SOLN
INTRAVENOUS | Status: DC
  Administered 2016-09-06: 08:00:00 via INTRAVENOUS

## 2016-09-06 MED ORDER — FENTANYL CITRATE (PF) 100 MCG/2ML IJ SOLN
INTRAMUSCULAR | Status: AC
  Filled 2016-09-06: qty 2

## 2016-09-06 NOTE — Anesthesia Preprocedure Evaluation (Signed)
Anesthesia Evaluation  Patient identified by MRN, date of birth, ID band Patient awake    Reviewed: Allergy & Precautions, NPO status , Patient's Chart, lab work & pertinent test results  Airway Mallampati: II  TM Distance: >3 FB Neck ROM: Full    Dental no notable dental hx.    Pulmonary neg pulmonary ROS,    Pulmonary exam normal breath sounds clear to auscultation       Cardiovascular negative cardio ROS Normal cardiovascular exam Rhythm:Regular Rate:Normal     Neuro/Psych negative neurological ROS  negative psych ROS   GI/Hepatic negative GI ROS, Neg liver ROS,   Endo/Other  diabetes  Renal/GU negative Renal ROS  negative genitourinary   Musculoskeletal negative musculoskeletal ROS (+)   Abdominal   Peds negative pediatric ROS (+)  Hematology negative hematology ROS (+)   Anesthesia Other Findings   Reproductive/Obstetrics negative OB ROS                             Anesthesia Physical Anesthesia Plan  ASA: II  Anesthesia Plan: General   Post-op Pain Management:    Induction: Intravenous  Airway Management Planned: LMA  Additional Equipment:   Intra-op Plan:   Post-operative Plan: Extubation in OR  Informed Consent: I have reviewed the patients History and Physical, chart, labs and discussed the procedure including the risks, benefits and alternatives for the proposed anesthesia with the patient or authorized representative who has indicated his/her understanding and acceptance.   Dental advisory given  Plan Discussed with: CRNA and Surgeon  Anesthesia Plan Comments:         Anesthesia Quick Evaluation

## 2016-09-06 NOTE — Transfer of Care (Signed)
Immediate Anesthesia Transfer of Care Note  Patient: Jeanett SchleinAmanda L Espin  Procedure(s) Performed: Procedure(s): MRI RIGHT SHOULDER WITHOUT (N/A)  Patient Location: PACU  Anesthesia Type:General  Level of Consciousness: awake, alert  and oriented  Airway & Oxygen Therapy: Patient Spontanous Breathing and Patient connected to nasal cannula oxygen  Post-op Assessment: Report given to RN, Post -op Vital signs reviewed and stable and Patient moving all extremities X 4  Post vital signs: Reviewed and stable  Last Vitals:  Vitals:   09/06/16 0702  BP: (!) 165/103  Pulse: 85  Resp: 20  Temp: 36.7 C    Last Pain:  Vitals:   09/06/16 0738  PainSc: 8       Patients Stated Pain Goal: 3 (09/06/16 0738)  Complications: No apparent anesthesia complications

## 2016-09-06 NOTE — Progress Notes (Signed)
MRI form faxed

## 2016-09-06 NOTE — Progress Notes (Signed)
Report given to robin roberts rn as caregiver 

## 2016-09-06 NOTE — Anesthesia Postprocedure Evaluation (Signed)
Anesthesia Post Note  Patient: Jeanett Schleinmanda L Araque  Procedure(s) Performed: Procedure(s) (LRB): MRI RIGHT SHOULDER WITHOUT (N/A)  Patient location during evaluation: PACU Anesthesia Type: General Level of consciousness: awake and alert Pain management: pain level controlled Vital Signs Assessment: post-procedure vital signs reviewed and stable Respiratory status: spontaneous breathing, nonlabored ventilation, respiratory function stable and patient connected to nasal cannula oxygen Cardiovascular status: blood pressure returned to baseline and stable Postop Assessment: no signs of nausea or vomiting Anesthetic complications: no    Last Vitals:  Vitals:   09/06/16 0702 09/06/16 0955  BP: (!) 165/103 137/80  Pulse: 85 81  Resp: 20 (!) 25  Temp: 36.7 C 36.6 C    Last Pain:  Vitals:   09/06/16 0955  PainSc: 0-No pain                 Yani Lal S

## 2016-09-07 ENCOUNTER — Encounter (HOSPITAL_COMMUNITY): Payer: Self-pay | Admitting: Radiology

## 2016-09-07 MED FILL — Phenylephrine-NaCl Pref Syringe 0.2 MG/5ML-0.9% (40 MCG/ML): INTRAVENOUS | Qty: 5 | Status: AC

## 2016-09-07 MED FILL — Lidocaine HCl IV Inj 20 MG/ML: INTRAVENOUS | Qty: 5 | Status: AC

## 2016-09-07 MED FILL — Propofol IV Emul 200 MG/20ML (10 MG/ML): INTRAVENOUS | Qty: 20 | Status: AC

## 2016-09-07 MED FILL — Ondansetron HCl Inj 4 MG/2ML (2 MG/ML): INTRAMUSCULAR | Qty: 2 | Status: AC

## 2016-09-07 MED FILL — Fentanyl Citrate Preservative Free (PF) Inj 100 MCG/2ML: INTRAMUSCULAR | Qty: 2 | Status: AC

## 2016-09-07 MED FILL — Midazolam HCl Inj 2 MG/2ML (Base Equivalent): INTRAMUSCULAR | Qty: 2 | Status: AC

## 2016-09-07 MED FILL — Lactated Ringer's Solution: INTRAVENOUS | Qty: 1000 | Status: AC

## 2017-04-04 ENCOUNTER — Other Ambulatory Visit: Payer: Self-pay | Admitting: Family Medicine

## 2017-04-04 DIAGNOSIS — Z1231 Encounter for screening mammogram for malignant neoplasm of breast: Secondary | ICD-10-CM

## 2017-05-15 ENCOUNTER — Ambulatory Visit: Payer: BC Managed Care – PPO

## 2017-05-18 ENCOUNTER — Ambulatory Visit
Admission: RE | Admit: 2017-05-18 | Discharge: 2017-05-18 | Disposition: A | Discharge status: Home / Self Care | Payer: BC Managed Care – PPO | Source: Ambulatory Visit | Attending: Family Medicine | Admitting: Family Medicine

## 2017-05-18 DIAGNOSIS — Z1231 Encounter for screening mammogram for malignant neoplasm of breast: Secondary | ICD-10-CM

## 2017-08-15 ENCOUNTER — Encounter (HOSPITAL_COMMUNITY): Payer: Self-pay

## 2018-05-08 ENCOUNTER — Other Ambulatory Visit: Payer: Self-pay | Admitting: Family Medicine

## 2018-05-08 DIAGNOSIS — Z1231 Encounter for screening mammogram for malignant neoplasm of breast: Secondary | ICD-10-CM

## 2018-06-06 ENCOUNTER — Ambulatory Visit
Admission: RE | Admit: 2018-06-06 | Discharge: 2018-06-06 | Disposition: A | Discharge status: Home / Self Care | Payer: BC Managed Care – PPO | Source: Ambulatory Visit | Attending: Family Medicine | Admitting: Family Medicine

## 2018-06-06 DIAGNOSIS — Z1231 Encounter for screening mammogram for malignant neoplasm of breast: Secondary | ICD-10-CM

## 2018-08-09 ENCOUNTER — Encounter (HOSPITAL_COMMUNITY): Payer: Self-pay

## 2019-01-30 ENCOUNTER — Other Ambulatory Visit (HOSPITAL_COMMUNITY): Payer: Self-pay | Admitting: Orthopaedic Surgery

## 2019-01-30 ENCOUNTER — Ambulatory Visit (HOSPITAL_COMMUNITY)
Admission: RE | Admit: 2019-01-30 | Discharge: 2019-01-30 | Disposition: A | Discharge status: Home / Self Care | Payer: 59 | Source: Ambulatory Visit | Attending: Cardiology | Admitting: Cardiology

## 2019-01-30 ENCOUNTER — Other Ambulatory Visit: Payer: Self-pay

## 2019-01-30 DIAGNOSIS — M7989 Other specified soft tissue disorders: Secondary | ICD-10-CM | POA: Insufficient documentation

## 2019-01-30 DIAGNOSIS — M79604 Pain in right leg: Secondary | ICD-10-CM

## 2019-01-30 NOTE — Progress Notes (Signed)
Right lower extremity venous duplex. Results in Chart review CV Proc. Chibueze Beasley,RVS 01/30/2019, 1:20 PM

## 2019-02-27 ENCOUNTER — Other Ambulatory Visit: Payer: Self-pay | Admitting: Family Medicine

## 2019-03-01 ENCOUNTER — Other Ambulatory Visit: Payer: Self-pay | Admitting: Family Medicine

## 2019-03-01 DIAGNOSIS — N6320 Unspecified lump in the left breast, unspecified quadrant: Secondary | ICD-10-CM

## 2019-03-07 ENCOUNTER — Ambulatory Visit
Admission: RE | Admit: 2019-03-07 | Discharge: 2019-03-07 | Disposition: A | Discharge status: Home / Self Care | Payer: BC Managed Care – PPO | Source: Ambulatory Visit | Attending: Family Medicine | Admitting: Family Medicine

## 2019-03-07 ENCOUNTER — Other Ambulatory Visit: Payer: Self-pay | Admitting: Family Medicine

## 2019-03-07 ENCOUNTER — Other Ambulatory Visit: Payer: Self-pay

## 2019-03-07 DIAGNOSIS — R599 Enlarged lymph nodes, unspecified: Secondary | ICD-10-CM

## 2019-03-07 DIAGNOSIS — N6320 Unspecified lump in the left breast, unspecified quadrant: Secondary | ICD-10-CM

## 2019-03-11 ENCOUNTER — Other Ambulatory Visit: Payer: Self-pay

## 2019-03-11 ENCOUNTER — Ambulatory Visit
Admission: RE | Admit: 2019-03-11 | Discharge: 2019-03-11 | Disposition: A | Discharge status: Home / Self Care | Payer: 59 | Source: Ambulatory Visit | Attending: Family Medicine | Admitting: Family Medicine

## 2019-03-11 ENCOUNTER — Ambulatory Visit
Admission: RE | Admit: 2019-03-11 | Discharge: 2019-03-11 | Disposition: A | Discharge status: Home / Self Care | Payer: BC Managed Care – PPO | Source: Ambulatory Visit | Attending: Family Medicine | Admitting: Family Medicine

## 2019-03-11 DIAGNOSIS — N6320 Unspecified lump in the left breast, unspecified quadrant: Secondary | ICD-10-CM

## 2019-03-11 DIAGNOSIS — R599 Enlarged lymph nodes, unspecified: Secondary | ICD-10-CM

## 2019-03-21 ENCOUNTER — Other Ambulatory Visit: Payer: Self-pay | Admitting: General Surgery

## 2019-03-21 DIAGNOSIS — N6320 Unspecified lump in the left breast, unspecified quadrant: Secondary | ICD-10-CM

## 2019-03-22 ENCOUNTER — Other Ambulatory Visit: Payer: Self-pay | Admitting: General Surgery

## 2019-03-22 DIAGNOSIS — N6320 Unspecified lump in the left breast, unspecified quadrant: Secondary | ICD-10-CM

## 2019-04-10 NOTE — Pre-Procedure Instructions (Signed)
[facility] [phone] [place] [address] [facility] [address] [place] [id number] Phone: [phone] Fax: [phone]      Your procedure is scheduled on Tuesday 04-16-19  [facility] at 0700 A.M., and check in at the Admitting office.  Call this number if you have problems the morning of surgery:  [phone]  Call [phone] if you have any questions prior to your surgery date Monday-Friday 8am-4pm    Remember:  Do not eat  after midnight the night before your surgery  You may drink clear liquids until 6am the morning of your surgery.   Clear liquids allowed are: Water, Non-Citrus Juices (without pulp), Carbonated Beverages, Clear Tea, Black Coffee Only, and Gatorade   Take these medicines the morning of surgery with A SIP OF WATER :none  7 days prior to surgery STOP taking any Aspirin (unless otherwise instructed by your surgeon), Aleve, Naproxen, Ibuprofen, Motrin, Advil, Goody's, BC's, all herbal medications, fish oil, and all vitamins.   WHAT DO I DO ABOUT MY DIABETES MEDICATION?   Marland Kitchen. Do not take oral diabetes medicines (pills) including METFORMIN  And JANUVIA the morning of surgery.        Do not take TRULICITY the morning of surgery.        Do not inject  TRESIBA the night before your surgery.   How to Manage Your Diabetes Before and After Surgery  Why is it important to control my blood sugar before and after surgery? . Improving blood sugar levels before and after surgery helps healing and can limit problems. . A way of improving blood sugar control is eating a healthy diet by: o  Eating less sugar and carbohydrates o  Increasing activity/exercise o  Talking with your doctor about reaching your blood sugar goals . High blood sugars (greater than 180 mg/dL) can raise your risk of infections and slow your recovery, so you will need to focus on controlling  your diabetes during the weeks before surgery. . Make sure that the doctor who takes care of your diabetes knows about your planned surgery including the date and location.  How do I manage my blood sugar before surgery? . Check your blood sugar at least 4 times a day, starting 2 days before surgery, to make sure that the level is not too high or low. o Check your blood sugar the morning of your surgery when you wake up and every 2 hours until you get to the Short Stay unit. . If your blood sugar is less than 70 mg/dL, you will need to treat for low blood sugar: o Do not take insulin. o Treat a low blood sugar (less than 70 mg/dL) with  cup of clear juice (cranberry or apple), 4 glucose tablets, OR glucose gel. o Recheck blood sugar in 15 minutes after treatment (to make sure it is greater than 70 mg/dL). If your blood sugar is not greater than 70 mg/dL on recheck, call [phone] for further instructions. . Report your blood sugar to the short stay nurse when you get to Short Stay.  . If you are admitted to the hospital after surgery: o Your blood sugar will be checked by the staff and you will probably be given insulin after surgery (instead of oral diabetes medicines) to make sure you have good blood sugar levels. o The goal for blood sugar control after surgery is 80-180 mg/dL.   The Morning  of Surgery  Do not wear jewelry, make-up or nail polish.  Do not wear lotions, powders, or perfumes, or deodorant  Do not shave 48 hours prior to surgery.  .  Do not bring valuables to the hospital.  [facility] is not responsible for any belongings or valuables.  If you are a smoker, DO NOT Smoke 24 hours prior to surgery IF you wear a CPAP at night please bring your mask, tubing, and machine the morning of surgery   Remember that you must have someone to transport you home after your surgery, and remain with you for 24 hours if you are discharged the same day.   Contacts, glasses, hearing  aids, dentures or bridgework may not be worn into surgery.    Leave your suitcase in the car.  After surgery it may be brought to your room.  For patients admitted to the hospital, discharge time will be determined by your treatment team.  Patients discharged the day of surgery will not be allowed to drive home.    Special instructions:   [place]- Preparing For Surgery  Before surgery, you can play an important role. Because skin is not sterile, your skin needs to be as free of germs as possible. You can reduce the number of germs on your skin by washing with CHG (chlorahexidine gluconate) Soap before surgery.  CHG is an antiseptic cleaner which kills germs and bonds with the skin to continue killing germs even after washing.    Oral Hygiene is also important to reduce your risk of infection.  Remember - BRUSH YOUR TEETH THE MORNING OF SURGERY WITH YOUR REGULAR TOOTHPASTE  Please do not use if you have an allergy to CHG or antibacterial soaps. If your skin becomes reddened/irritated stop using the CHG.  Do not shave (including legs and underarms) for at least 48 hours prior to first CHG shower. It is OK to shave your face.  Please follow these instructions carefully.   1. Shower the NIGHT BEFORE SURGERY and the MORNING OF SURGERY with CHG Soap.   2. If you chose to wash your hair, wash your hair first as usual with your normal shampoo.  3. After you shampoo, rinse your hair and body thoroughly to remove the shampoo.  4. Use CHG as you would any other liquid soap. You can apply CHG directly to the skin and wash gently with a scrungie or a clean washcloth.   5. Apply the CHG Soap to your body ONLY FROM THE NECK DOWN.  Do not use on open wounds or open sores. Avoid contact with your eyes, ears, mouth and genitals (private parts). Wash Face and genitals (private parts)  with your normal soap.   6. Wash thoroughly, paying special attention to the area where your surgery will be  performed.  7. Thoroughly rinse your body with warm water from the neck down.  8. DO NOT shower/wash with your normal soap after using and rinsing off the CHG Soap.  9. Pat yourself dry with a CLEAN TOWEL.  10. Wear CLEAN PAJAMAS to bed the night before surgery, wear comfortable clothes the morning of surgery  11. Place CLEAN SHEETS on your bed the night of your first shower and DO NOT SLEEP WITH PETS.  Day of Surgery:  Do not apply any deodorants/lotions. Please shower the morning of surgery with the CHG soap  Please wear clean clothes to the hospital/surgery center.   Remember to brush your teeth WITH YOUR REGULAR TOOTHPASTE.  Please read over the fact sheets that you were given.

## 2019-04-11 ENCOUNTER — Encounter (HOSPITAL_COMMUNITY): Payer: Self-pay

## 2019-04-11 ENCOUNTER — Other Ambulatory Visit: Payer: Self-pay

## 2019-04-11 ENCOUNTER — Encounter (HOSPITAL_COMMUNITY)
Admission: RE | Admit: 2019-04-11 | Discharge: 2019-04-11 | Disposition: A | Discharge status: Home / Self Care | Payer: 59 | Source: Ambulatory Visit | Attending: General Surgery | Admitting: General Surgery

## 2019-04-11 DIAGNOSIS — E119 Type 2 diabetes mellitus without complications: Secondary | ICD-10-CM | POA: Diagnosis not present

## 2019-04-11 DIAGNOSIS — Z01818 Encounter for other preprocedural examination: Secondary | ICD-10-CM | POA: Insufficient documentation

## 2019-04-11 DIAGNOSIS — Z1159 Encounter for screening for other viral diseases: Secondary | ICD-10-CM | POA: Diagnosis not present

## 2019-04-11 LAB — COMPREHENSIVE METABOLIC PANEL
ALT: 27 U/L (ref 0–44)
AST: 25 U/L (ref 15–41)
Albumin: 3.9 g/dL (ref 3.5–5.0)
Alkaline Phosphatase: 87 U/L (ref 38–126)
Anion gap: 10 (ref 5–15)
BUN: 7 mg/dL — ABNORMAL LOW (ref 8–23)
CO2: 26 mmol/L (ref 22–32)
Calcium: 9.3 mg/dL (ref 8.9–10.3)
Chloride: 103 mmol/L (ref 98–111)
Creatinine, Ser: 0.84 mg/dL (ref 0.44–1.00)
GFR calc Af Amer: >60 mL/min (ref >60)
GFR calc non Af Amer: >60 mL/min (ref >60)
Glucose, Bld: 224 mg/dL — ABNORMAL HIGH (ref 70–99)
Potassium: 3.6 mmol/L (ref 3.5–5.1)
Sodium: 139 mmol/L (ref 135–145)
Total Bilirubin: 1.4 mg/dL — ABNORMAL HIGH (ref 0.3–1.2)
Total Protein: 7.2 g/dL (ref 6.5–8.1)

## 2019-04-11 LAB — CBC WITH DIFFERENTIAL/PLATELET
Abs Immature Granulocytes: 0.01 10*3/uL (ref 0.00–0.07)
Basophils Absolute: 0 10*3/uL (ref 0.0–0.1)
Basophils Relative: 1 %
Eosinophils Absolute: 0.1 10*3/uL (ref 0.0–0.5)
Eosinophils Relative: 2 %
HCT: 43.8 % (ref 36.0–46.0)
Hemoglobin: 13.6 g/dL (ref 12.0–15.0)
Immature Granulocytes: 0 %
Lymphocytes Relative: 38 %
Lymphs Abs: 1.6 10*3/uL (ref 0.7–4.0)
MCH: 28 pg (ref 26.0–34.0)
MCHC: 31.1 g/dL (ref 30.0–36.0)
MCV: 90.3 fL (ref 80.0–100.0)
Monocytes Absolute: 0.4 10*3/uL (ref 0.1–1.0)
Monocytes Relative: 9 %
Neutro Abs: 2.2 10*3/uL (ref 1.7–7.7)
Neutrophils Relative %: 50 %
Platelets: 263 10*3/uL (ref 150–400)
RBC: 4.85 MIL/uL (ref 3.87–5.11)
RDW: 11.6 % (ref 11.5–15.5)
WBC: 4.4 10*3/uL (ref 4.0–10.5)
nRBC: 0 % (ref 0.0–0.2)

## 2019-04-11 LAB — GLUCOSE, CAPILLARY: Glucose-Capillary: 225 mg/dL — ABNORMAL HIGH (ref 70–99)

## 2019-04-11 LAB — HEMOGLOBIN A1C
Hgb A1c MFr Bld: 7.7 % — ABNORMAL HIGH (ref 4.8–5.6)
Mean Plasma Glucose: 174.29 mg/dL

## 2019-04-11 NOTE — Progress Notes (Addendum)
PCP - Dr. Dorthy Cooler Cardiologist - patient denies  Chest x-ray - n/a EKG - 04/11/2019 Stress Test - patient denies ECHO - patient denies Cardiac Cath - patient denies  Sleep Study -  patient denies CPAP -   Fasting Blood Sugar - 130's Checks Blood Sugar 1 time a day Patient's CBG 225 at PAT appointment.  Patient stated it was elevated this morning because she ate a PB&J sandwich last night and then ate breakfast right before coming.  Blood Thinner Instructions: Aspirin Instructions:   Anesthesia review:  n/a  Patient denies shortness of breath, fever, cough and chest pain at PAT appointment   Coronavirus Screening  Have you experienced the following symptoms:  Cough yes/no: No Fever (>100.77F)  yes/no: No Runny nose yes/no: No Sore throat yes/no: No Difficulty breathing/shortness of breath  yes/no: No  Have you or a family member traveled in the last 14 days and where? yes/no: No   If the patient indicates "YES" to the above questions, their PAT will be rescheduled to limit the exposure to others and, the surgeon will be notified. THE PATIENT WILL NEED TO BE ASYMPTOMATIC FOR 14 DAYS.   If the patient is not experiencing any of these symptoms, the PAT nurse will instruct them to NOT bring anyone with them to their appointment since they may have these symptoms or traveled as well.   Please remind your patients and families that hospital visitation restrictions are in effect and the importance of the restrictions.    Patient verbalized understanding of instructions that were given to them at the PAT appointment. Patient was also instructed that they will need to review over the PAT instructions again at home before surgery.

## 2019-04-11 NOTE — H&P (Signed)
Kendra Jennings Location: [facility] Patient #: [id number] DOB: 1955-12-26 Single / Language: Kendra Jennings / Race: Black or African American Female      History of Present Illness       .This is a 63 year old female, referred by Kendra Jennings at the [facility] for discordant biopsy left breast. Kendra Jennings is her PCP. Kendra Jennings is her endocrinologist      She has no prior history of breast surgery or breast problems. She is felt a lump in the left breast for some time. This is probably 3 cm in size. Her mammograms and ultrasound show slight distortion and density in the central left breast under the palpable lump. The ultrasound is reportedly showing only a 4.4 mm mass with vascularity. They question abnormal left axillary lymph node. Image guided biopsy of the lymph node was completely benign and it is felt to be concordant. 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 She was referred for excision she is certainly interested in having that done      Past history significant for insulin-dependent diabetes, hyperlipidemia, lap band which was effective at losing about 50 pounds. No adjustments for many years. Appendectomy. Right rotator cuff surgery. Social history reveals she is single. Never married. One daughter. 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 Denies alcohol or tobacco. Never married. Family history is negative for breast or ovarian cancer. Mother died of lung cancer. Father died of a stroke.     We had a long discussion about differential diagnosis. Technique. Risks. She wants to go ahead and have this done. She'll be scheduled for left breast lumpectomy with radioactive seed localization. I discussed the indications, details, techniques, and numerous risk of the surgery with her. She is aware of the risks of bleeding, infection, reoperation if cancer, cosmetic deformity, nipple deformity or numbness,  chronic pain. She understands all of these issues. All of her questions are answered. She agrees with this plan.   Addendum Note Kendra Jennings served as my chaperone throughout the encounter   Allergies  No Known Drug Allergies   Medication History  Januvia (100MG  Tablet, Oral) Active. Trulicity (0.75MG /0.5ML Soln Pen-inj, Subcutaneous) Active. Tyler Aas FlexTouch (100UNIT/ML Soln Pen-inj, Subcutaneous) Active. metFORMIN HCl (500MG  Tablet, Oral) Active. Aspirin (81MG  Tablet, Oral) Active. Medications Reconciled  Vitals Weight: 156.13 lb Height: 59in Body Surface Area: 1.66 m Body Mass Index: 31.53 kg/m  Pulse: 85 (Regular)  P.OX: 95% (Room air) BP: 160/98(Sitting, Left Arm, Standard)     Physical Exam  General Mental Status-Alert. General Appearance-Consistent with stated age. Hydration-Well hydrated. Voice-Normal.  Head and Neck Head-normocephalic, atraumatic with no lesions or palpable masses. Trachea-midline. Thyroid Gland Characteristics - normal size and consistency.  Eye Eyeball - Bilateral-Extraocular movements intact. Sclera/Conjunctiva - Bilateral-No scleral icterus.  Chest and Lung Exam Chest and lung exam reveals -quiet, even and easy respiratory effort with no use of accessory muscles and on auscultation, normal breath sounds, no adventitious sounds and normal vocal resonance. Inspection Chest Wall - Normal. Back - normal.  Breast Note: Breasts are medium to slightly large in size. Symmetrical and soft. In the left breast at about the 3 o'clock position there is a 3 cm mass. This does not seem to involve the skin. It is relatively spherical in size. This may well be a large fibroadenoma. There is no other mass or skin change or adenopathy on either side.   Cardiovascular Cardiovascular examination reveals -normal heart sounds, regular rate and rhythm  with no murmurs and normal pedal pulses  bilaterally.  Abdomen Inspection Inspection of the abdomen reveals - No Hernias. Skin - Scar - Note: Trocar sites from lap band. Lap band port right abdomen palpable. Palpation/Percussion Palpation and Percussion of the abdomen reveal - Soft, Non Tender, No Rebound tenderness, No Rigidity (guarding) and No hepatosplenomegaly. Auscultation Auscultation of the abdomen reveals - Bowel sounds normal.  Neurologic Neurologic evaluation reveals -alert and oriented x 3 with no impairment of recent or remote memory. Mental Status-Normal.  Musculoskeletal Normal Exam - Left-Upper Extremity Strength Normal and Lower Extremity Strength Normal. Normal Exam - Right-Upper Extremity Strength Normal and Lower Extremity Strength Normal.  Lymphatic Head & Neck  General Head & Neck Lymphatics: Bilateral - Description - Normal. Axillary  General Axillary Region: Bilateral - Description - Normal. Tenderness - Non Tender. Femoral & Inguinal  Generalized Femoral & Inguinal Lymphatics: Bilateral - Description - Normal. Tenderness - Non Tender.    Assessment & Plan   LEFT BREAST MASS (N63.20)  You felt a lump in your left breast for some time Recent imaging studies suggest a 4.4 mm mass in the left breast at the 2 o'clock position This feels much larger on physical exam Image guided biopsy of the left breast mass shows benign fibroadenoma, but the radiologist felt that this was discordant and is concerned that we did not get enough tissue to rule out cancer Image guided biopsy of the left axillary lymph node is completely benign and that seems to be concordant and nothing further needs to be done  After discussion, we agreed to proceed with left breast lumpectomy with radioactive seed localization We will do this after July 4 at your request I discussed the indications, differential diagnosis, techniques, and risk of the surgery in detail with you   BMI 31.0-31.9,ADULT (Z68.31)  TYPE 2  DIABETES MELLITUS WITH INSULIN THERAPY (E11.9)  HISTORY OF LAPAROSCOPIC ADJUSTABLE GASTRIC BANDING (Z98.84)    Kendra Jennings, KendraD., [facility] General and Minimally invasive Surgery Breast and Colorectal Surgery Office:   [phone] Pager:   [phone]

## 2019-04-12 ENCOUNTER — Other Ambulatory Visit (HOSPITAL_COMMUNITY)
Admission: RE | Admit: 2019-04-12 | Discharge: 2019-04-12 | Disposition: A | Discharge status: Home / Self Care | Payer: 59 | Source: Ambulatory Visit | Attending: General Surgery | Admitting: General Surgery

## 2019-04-12 DIAGNOSIS — Z01818 Encounter for other preprocedural examination: Secondary | ICD-10-CM | POA: Diagnosis not present

## 2019-04-12 LAB — SARS CORONAVIRUS 2 (TAT 6-24 HRS): SARS Coronavirus 2: NEGATIVE

## 2019-04-12 NOTE — Anesthesia Preprocedure Evaluation (Addendum)
Anesthesia Evaluation  Patient identified by MRN, date of birth, ID band Patient awake    Reviewed: Allergy & Precautions, H&P , NPO status , Patient's Chart, lab work & pertinent test results  Airway Mallampati: II  TM Distance: >3 FB Neck ROM: Full    Dental no notable dental hx. (+) Teeth Intact, Dental Advisory Given   Pulmonary neg pulmonary ROS,    Pulmonary exam normal breath sounds clear to auscultation       Cardiovascular negative cardio ROS   Rhythm:Regular Rate:Normal     Neuro/Psych negative neurological ROS  negative psych ROS   GI/Hepatic negative GI ROS, Neg liver ROS,   Endo/Other  diabetes, Oral Hypoglycemic Agents, Insulin Dependent  Renal/GU negative Renal ROS  negative genitourinary   Musculoskeletal   Abdominal   Peds  Hematology negative hematology ROS (+)   Anesthesia Other Findings   Reproductive/Obstetrics negative OB ROS                           Anesthesia Physical Anesthesia Plan  ASA: III  Anesthesia Plan: General   Post-op Pain Management:    Induction: Intravenous  PONV Risk Score and Plan: 4 or greater and Ondansetron, Midazolam and Treatment may vary due to age or medical condition  Airway Management Planned: LMA  Additional Equipment:   Intra-op Plan:   Post-operative Plan: Extubation in OR  Informed Consent: I have reviewed the patients History and Physical, chart, labs and discussed the procedure including the risks, benefits and alternatives for the proposed anesthesia with the patient or authorized representative who has indicated his/her understanding and acceptance.     Dental advisory given  Plan Discussed with: CRNA  Anesthesia Plan Comments: (Elevated BP at PAT 162/95. Pt says her BP is "up and down" but denies diagnosis of HTN. EKG shows sinus rhythm, no significant change from previous. On auscultation heart is RRR no MRG.  Discussed that she should follow up with PCP for recheck of BP, pt verbalized understanding. )       Anesthesia Quick Evaluation

## 2019-04-15 ENCOUNTER — Other Ambulatory Visit: Payer: Self-pay

## 2019-04-15 ENCOUNTER — Ambulatory Visit
Admission: RE | Admit: 2019-04-15 | Discharge: 2019-04-15 | Disposition: A | Discharge status: Home / Self Care | Payer: BC Managed Care – PPO | Source: Ambulatory Visit | Attending: General Surgery | Admitting: General Surgery

## 2019-04-15 DIAGNOSIS — N6320 Unspecified lump in the left breast, unspecified quadrant: Secondary | ICD-10-CM

## 2019-04-16 ENCOUNTER — Ambulatory Visit (HOSPITAL_COMMUNITY): Payer: 59 | Admitting: Certified Registered Nurse Anesthetist

## 2019-04-16 ENCOUNTER — Encounter (HOSPITAL_COMMUNITY): Payer: Self-pay

## 2019-04-16 ENCOUNTER — Ambulatory Visit
Admission: RE | Admit: 2019-04-16 | Discharge: 2019-04-16 | Disposition: A | Discharge status: Home / Self Care | Payer: BC Managed Care – PPO | Source: Ambulatory Visit | Attending: General Surgery | Admitting: General Surgery

## 2019-04-16 ENCOUNTER — Ambulatory Visit (HOSPITAL_COMMUNITY)
Admission: RE | Admit: 2019-04-16 | Discharge: 2019-04-16 | Disposition: A | Discharge status: Home / Self Care | Payer: 59 | Attending: General Surgery | Admitting: General Surgery

## 2019-04-16 ENCOUNTER — Ambulatory Visit (HOSPITAL_COMMUNITY): Payer: 59 | Admitting: Physician Assistant

## 2019-04-16 ENCOUNTER — Encounter (HOSPITAL_COMMUNITY)
Admission: RE | Disposition: A | Discharge status: Home / Self Care | Payer: Self-pay | Source: Home / Self Care | Attending: General Surgery

## 2019-04-16 DIAGNOSIS — Z794 Long term (current) use of insulin: Secondary | ICD-10-CM | POA: Insufficient documentation

## 2019-04-16 DIAGNOSIS — N6320 Unspecified lump in the left breast, unspecified quadrant: Secondary | ICD-10-CM | POA: Diagnosis present

## 2019-04-16 DIAGNOSIS — E119 Type 2 diabetes mellitus without complications: Secondary | ICD-10-CM | POA: Diagnosis not present

## 2019-04-16 DIAGNOSIS — Z801 Family history of malignant neoplasm of trachea, bronchus and lung: Secondary | ICD-10-CM | POA: Diagnosis not present

## 2019-04-16 DIAGNOSIS — N6489 Other specified disorders of breast: Secondary | ICD-10-CM | POA: Diagnosis not present

## 2019-04-16 DIAGNOSIS — Z7982 Long term (current) use of aspirin: Secondary | ICD-10-CM | POA: Insufficient documentation

## 2019-04-16 DIAGNOSIS — Z9884 Bariatric surgery status: Secondary | ICD-10-CM | POA: Insufficient documentation

## 2019-04-16 HISTORY — DX: Unspecified lump in the left breast, unspecified quadrant: N63.20

## 2019-04-16 HISTORY — PX: BREAST LUMPECTOMY WITH RADIOACTIVE SEED LOCALIZATION: SHX6424

## 2019-04-16 LAB — GLUCOSE, CAPILLARY
Glucose-Capillary: 153 mg/dL — ABNORMAL HIGH (ref 70–99)
Glucose-Capillary: 153 mg/dL — ABNORMAL HIGH (ref 70–99)

## 2019-04-16 SURGERY — BREAST LUMPECTOMY WITH RADIOACTIVE SEED LOCALIZATION
Anesthesia: General | Site: Breast | Laterality: Left

## 2019-04-16 MED ORDER — HYDROMORPHONE HCL 1 MG/ML IJ SOLN: 0.2500 mg | INTRAMUSCULAR | Status: DC | PRN

## 2019-04-16 MED ORDER — EPHEDRINE 5 MG/ML INJ
INTRAVENOUS | Status: AC
  Filled 2019-04-16: qty 10

## 2019-04-16 MED ORDER — CEFAZOLIN SODIUM-DEXTROSE 2-4 GM/100ML-% IV SOLN
2.0000 g | INTRAVENOUS | Status: AC
  Administered 2019-04-16: 2 g via INTRAVENOUS
  Filled 2019-04-16: qty 100

## 2019-04-16 MED ORDER — 0.9 % SODIUM CHLORIDE (POUR BTL) OPTIME
TOPICAL | Status: DC | PRN
  Administered 2019-04-16: 1000 mL

## 2019-04-16 MED ORDER — LIDOCAINE 2% (20 MG/ML) 5 ML SYRINGE
INTRAMUSCULAR | Status: AC
  Filled 2019-04-16: qty 5

## 2019-04-16 MED ORDER — BUPIVACAINE-EPINEPHRINE (PF) 0.25% -1:200000 IJ SOLN
INTRAMUSCULAR | Status: AC
  Filled 2019-04-16: qty 30

## 2019-04-16 MED ORDER — BUPIVACAINE-EPINEPHRINE 0.25% -1:200000 IJ SOLN
INTRAMUSCULAR | Status: DC | PRN
  Administered 2019-04-16: 10 mL

## 2019-04-16 MED ORDER — FENTANYL CITRATE (PF) 250 MCG/5ML IJ SOLN
INTRAMUSCULAR | Status: AC
  Filled 2019-04-16: qty 5

## 2019-04-16 MED ORDER — LACTATED RINGERS IV SOLN
INTRAVENOUS | Status: DC
  Administered 2019-04-16: 07:00:00 via INTRAVENOUS

## 2019-04-16 MED ORDER — LIDOCAINE 2% (20 MG/ML) 5 ML SYRINGE
INTRAMUSCULAR | Status: DC | PRN
  Administered 2019-04-16: 60 mg via INTRAVENOUS

## 2019-04-16 MED ORDER — EPHEDRINE SULFATE-NACL 50-0.9 MG/10ML-% IV SOSY
PREFILLED_SYRINGE | INTRAVENOUS | Status: DC | PRN
  Administered 2019-04-16: 7.5 mg via INTRAVENOUS
  Administered 2019-04-16: 5 mg via INTRAVENOUS
  Administered 2019-04-16: 7.5 mg via INTRAVENOUS
  Administered 2019-04-16 (×3): 5 mg via INTRAVENOUS

## 2019-04-16 MED ORDER — ONDANSETRON HCL 4 MG/2ML IJ SOLN
INTRAMUSCULAR | Status: AC
  Filled 2019-04-16: qty 2

## 2019-04-16 MED ORDER — FENTANYL CITRATE (PF) 250 MCG/5ML IJ SOLN
INTRAMUSCULAR | Status: DC | PRN
  Administered 2019-04-16: 50 ug via INTRAVENOUS
  Administered 2019-04-16 (×2): 25 ug via INTRAVENOUS

## 2019-04-16 MED ORDER — HYDROCODONE-ACETAMINOPHEN 5-325 MG PO TABS: 1.0000 | ORAL_TABLET | Freq: Four times a day (QID) | ORAL | 0 refills | Status: AC | PRN

## 2019-04-16 MED ORDER — ONDANSETRON HCL 4 MG/2ML IJ SOLN
INTRAMUSCULAR | Status: DC | PRN
  Administered 2019-04-16: 4 mg via INTRAVENOUS

## 2019-04-16 MED ORDER — OXYCODONE HCL 5 MG PO TABS
5.0000 mg | ORAL_TABLET | ORAL | Status: DC | PRN
  Administered 2019-04-16: 5 mg via ORAL

## 2019-04-16 MED ORDER — OXYCODONE HCL 5 MG PO TABS
ORAL_TABLET | ORAL | Status: AC
  Filled 2019-04-16: qty 1

## 2019-04-16 MED ORDER — DEXAMETHASONE SODIUM PHOSPHATE 10 MG/ML IJ SOLN
INTRAMUSCULAR | Status: AC
  Filled 2019-04-16: qty 1

## 2019-04-16 MED ORDER — CHLORHEXIDINE GLUCONATE CLOTH 2 % EX PADS: 6.0000 | MEDICATED_PAD | Freq: Once | CUTANEOUS | Status: DC

## 2019-04-16 MED ORDER — CELECOXIB 200 MG PO CAPS
200.0000 mg | ORAL_CAPSULE | ORAL | Status: AC
  Administered 2019-04-16: 200 mg via ORAL
  Filled 2019-04-16: qty 1

## 2019-04-16 MED ORDER — ACETAMINOPHEN 500 MG PO TABS
1000.0000 mg | ORAL_TABLET | ORAL | Status: AC
  Administered 2019-04-16: 1000 mg via ORAL
  Filled 2019-04-16: qty 2

## 2019-04-16 MED ORDER — GABAPENTIN 300 MG PO CAPS
300.0000 mg | ORAL_CAPSULE | ORAL | Status: AC
  Administered 2019-04-16: 300 mg via ORAL
  Filled 2019-04-16: qty 1

## 2019-04-16 MED ORDER — PHENYLEPHRINE 40 MCG/ML (10ML) SYRINGE FOR IV PUSH (FOR BLOOD PRESSURE SUPPORT)
PREFILLED_SYRINGE | INTRAVENOUS | Status: AC
  Filled 2019-04-16: qty 20

## 2019-04-16 MED ORDER — PROMETHAZINE HCL 25 MG/ML IJ SOLN
6.2500 mg | Freq: Four times a day (QID) | INTRAMUSCULAR | Status: DC | PRN
  Administered 2019-04-16: 6.25 mg via INTRAVENOUS

## 2019-04-16 MED ORDER — PROMETHAZINE HCL 25 MG/ML IJ SOLN
INTRAMUSCULAR | Status: AC
  Filled 2019-04-16: qty 1

## 2019-04-16 MED ORDER — MIDAZOLAM HCL 2 MG/2ML IJ SOLN
INTRAMUSCULAR | Status: AC
  Filled 2019-04-16: qty 2

## 2019-04-16 MED ORDER — SODIUM CHLORIDE 0.9% FLUSH: 3.0000 mL | Freq: Two times a day (BID) | INTRAVENOUS | Status: DC

## 2019-04-16 MED ORDER — MIDAZOLAM HCL 2 MG/2ML IJ SOLN
INTRAMUSCULAR | Status: DC | PRN
  Administered 2019-04-16: 2 mg via INTRAVENOUS

## 2019-04-16 MED ORDER — PROPOFOL 10 MG/ML IV BOLUS
INTRAVENOUS | Status: DC | PRN
  Administered 2019-04-16: 120 mg via INTRAVENOUS

## 2019-04-16 MED ORDER — DEXAMETHASONE SODIUM PHOSPHATE 10 MG/ML IJ SOLN
INTRAMUSCULAR | Status: DC | PRN
  Administered 2019-04-16: 8 mg via INTRAVENOUS

## 2019-04-16 MED ORDER — PROPOFOL 10 MG/ML IV BOLUS
INTRAVENOUS | Status: AC
  Filled 2019-04-16: qty 20

## 2019-04-16 MED ORDER — PHENYLEPHRINE 40 MCG/ML (10ML) SYRINGE FOR IV PUSH (FOR BLOOD PRESSURE SUPPORT)
PREFILLED_SYRINGE | INTRAVENOUS | Status: DC | PRN
  Administered 2019-04-16 (×2): 80 ug via INTRAVENOUS
  Administered 2019-04-16: 160 ug via INTRAVENOUS
  Administered 2019-04-16: 80 ug via INTRAVENOUS

## 2019-04-16 SURGICAL SUPPLY — 39 items
APPLIER CLIP 9.375 MED OPEN (MISCELLANEOUS) ×3
BINDER BREAST LRG (GAUZE/BANDAGES/DRESSINGS) IMPLANT
BINDER BREAST XLRG (GAUZE/BANDAGES/DRESSINGS) ×2 IMPLANT
CANISTER SUCT 3000ML PPV (MISCELLANEOUS) ×3 IMPLANT
CHLORAPREP W/TINT 26 (MISCELLANEOUS) ×3 IMPLANT
CLIP APPLIE 9.375 MED OPEN (MISCELLANEOUS) ×1 IMPLANT
COVER PROBE W GEL 5X96 (DRAPES) ×3 IMPLANT
COVER SURGICAL LIGHT HANDLE (MISCELLANEOUS) ×3 IMPLANT
COVER WAND RF STERILE (DRAPES) ×3 IMPLANT
DERMABOND ADVANCED (GAUZE/BANDAGES/DRESSINGS) ×2
DERMABOND ADVANCED .7 DNX12 (GAUZE/BANDAGES/DRESSINGS) ×1 IMPLANT
DEVICE DUBIN SPECIMEN MAMMOGRA (MISCELLANEOUS) ×3 IMPLANT
DRAPE CHEST BREAST 15X10 FENES (DRAPES) ×3 IMPLANT
DRSG PAD ABDOMINAL 8X10 ST (GAUZE/BANDAGES/DRESSINGS) ×3 IMPLANT
ELECT CAUTERY BLADE 6.4 (BLADE) ×3 IMPLANT
ELECT REM PT RETURN 9FT ADLT (ELECTROSURGICAL) ×3
ELECTRODE REM PT RTRN 9FT ADLT (ELECTROSURGICAL) ×1 IMPLANT
GAUZE SPONGE 4X4 12PLY STRL LF (GAUZE/BANDAGES/DRESSINGS) ×3 IMPLANT
GLOVE EUDERMIC 7 POWDERFREE (GLOVE) ×6 IMPLANT
GOWN STRL REUS W/ TWL LRG LVL3 (GOWN DISPOSABLE) ×1 IMPLANT
GOWN STRL REUS W/ TWL XL LVL3 (GOWN DISPOSABLE) ×1 IMPLANT
GOWN STRL REUS W/TWL LRG LVL3 (GOWN DISPOSABLE) ×2
GOWN STRL REUS W/TWL XL LVL3 (GOWN DISPOSABLE) ×2
ILLUMINATOR WAVEGUIDE N/F (MISCELLANEOUS) IMPLANT
KIT BASIN OR (CUSTOM PROCEDURE TRAY) ×3 IMPLANT
KIT MARKER MARGIN INK (KITS) ×3 IMPLANT
LIGHT WAVEGUIDE WIDE FLAT (MISCELLANEOUS) IMPLANT
NDL HYPO 25GX1X1/2 BEV (NEEDLE) ×1 IMPLANT
NEEDLE HYPO 25GX1X1/2 BEV (NEEDLE) ×3 IMPLANT
NS IRRIG 1000ML POUR BTL (IV SOLUTION) ×3 IMPLANT
PACK GENERAL/GYN (CUSTOM PROCEDURE TRAY) ×3 IMPLANT
PAD ABD 8X10 STRL (GAUZE/BANDAGES/DRESSINGS) ×2 IMPLANT
SPONGE LAP 4X18 RFD (DISPOSABLE) ×3 IMPLANT
SUT MNCRL AB 4-0 PS2 18 (SUTURE) ×5 IMPLANT
SUT SILK 2 0 SH (SUTURE) ×3 IMPLANT
SUT VIC AB 3-0 SH 18 (SUTURE) ×3 IMPLANT
SYR CONTROL 10ML LL (SYRINGE) ×3 IMPLANT
TOWEL GREEN STERILE (TOWEL DISPOSABLE) ×3 IMPLANT
TOWEL GREEN STERILE FF (TOWEL DISPOSABLE) ×3 IMPLANT

## 2019-04-16 NOTE — Discharge Instructions (Signed)
[facility] °Office Phone Number [phone] ° °BREAST BIOPSY/ PARTIAL MASTECTOMY: POST OP INSTRUCTIONS ° °Always review your discharge instruction sheet given to you by the facility where your surgery was performed. ° °IF YOU HAVE DISABILITY OR FAMILY LEAVE FORMS, YOU MUST BRING THEM TO THE OFFICE FOR PROCESSING.  DO NOT GIVE THEM TO YOUR DOCTOR. ° °1. A prescription for pain medication may be given to you upon discharge.  Take your pain medication as prescribed, if needed.  If narcotic pain medicine is not needed, then you may take acetaminophen (Tylenol) or ibuprofen (Advil) as needed. °2. Take your usually prescribed medications unless otherwise directed °3. If you need a refill on your pain medication, please contact your pharmacy.  They will contact our office to request authorization.  Prescriptions will not be filled after 5pm or on week-ends. °4. You should eat very light the first 24 hours after surgery, such as soup, crackers, pudding, etc.  Resume your normal diet the day after surgery. °5. Most patients will experience some swelling and bruising in the breast.  Ice packs and a good support bra will help.  Swelling and bruising can take several days to resolve.  °6. It is common to experience some constipation if taking pain medication after surgery.  Increasing fluid intake and taking a stool softener will usually help or prevent this problem from occurring.  A mild laxative (Milk of Magnesia or Miralax) should be taken according to package directions if there are no bowel movements after 48 hours. °7. Unless discharge instructions indicate otherwise, you may remove your bandages 24-48 hours after surgery, and you may shower at that time.  You may have steri-strips (small skin tapes) in place directly over the incision.  These strips should be left on the skin for 7-10 days.  If your surgeon used skin glue on the incision, you may shower in 24 hours.  The glue will flake off over the  next 2-3 weeks.  Any sutures or staples will be removed at the office during your follow-up visit. °8. ACTIVITIES:  You may resume regular daily activities (gradually increasing) beginning the next day.  Wearing a good support bra or sports bra minimizes pain and swelling.  You may have sexual intercourse when it is comfortable. °a. You may drive when you no longer are taking prescription pain medication, you can comfortably wear a seatbelt, and you can safely maneuver your car and apply brakes. °b. RETURN TO WORK:  ______________________________________________________________________________________ °9. You should see your doctor in the office for a follow-up appointment approximately two weeks after your surgery.  Your doctor’s nurse will typically make your follow-up appointment when she calls you with your pathology report.  Expect your pathology report 2-3 business days after your surgery.  You may call to check if you do not hear from us after three days. °10. OTHER INSTRUCTIONS: _______________________________________________________________________________________________ _____________________________________________________________________________________________________________________________________ °_____________________________________________________________________________________________________________________________________ °_____________________________________________________________________________________________________________________________________ ° °WHEN TO CALL YOUR DOCTOR: °1. Fever over 101.0 °2. Nausea and/or vomiting. °3. Extreme swelling or bruising. °4. Continued bleeding from incision. °5. Increased pain, redness, or drainage from the incision. ° °The clinic staff is available to answer your questions during regular business hours.  Please don’t hesitate to call and ask to speak to one of the nurses for clinical concerns.  If you have a medical emergency, go to the nearest  emergency room or call 911.  A surgeon from [facility] is always on call at the hospital. ° °For further questions, please visit [email or web address]  ° ° ° ° ° ° ° ° ° ° °••••••••• ° ° °  Managing Your Pain After Surgery Without Opioids ° ° ° °Thank you for participating in our program to help patients manage their pain after surgery without opioids. This is part of our effort to provide you with the best care possible, without exposing you or your family to the risk that opioids pose. ° °What pain can I expect after surgery? °You can expect to have some pain after surgery. This is normal. The pain is typically worse the day after surgery, and quickly begins to get better. °Many studies have found that many patients are able to manage their pain after surgery with Over-the-Counter (OTC) medications such as Tylenol and Motrin. If you have a condition that does not allow you to take Tylenol or Motrin, notify your surgical team. ° °How will I manage my pain? °The best strategy for controlling your pain after surgery is around the clock pain control with Tylenol (acetaminophen) and Motrin (ibuprofen or Advil). Alternating these medications with each other allows you to maximize your pain control. In addition to Tylenol and Motrin, you can use heating pads or ice packs on your incisions to help reduce your pain. ° °How will I alternate your regular strength over-the-counter pain medication? °You will take a dose of pain medication every three hours. °; Start by taking 650 mg of Tylenol (2 pills of 325 mg) °; 3 hours later take 600 mg of Motrin (3 pills of 200 mg) °; 3 hours after taking the Motrin take 650 mg of Tylenol °; 3 hours after that take 600 mg of Motrin. ° ° °- 1 - ° °See example - if your first dose of Tylenol is at 12:00 PM ° ° °12:00 PM Tylenol 650 mg (2 pills of 325 mg)  °3:00 PM Motrin 600 mg (3 pills of 200 mg)  °6:00 PM Tylenol 650 mg (2 pills of 325 mg)  °9:00 PM Motrin 600 mg (3  pills of 200 mg)  °Continue alternating every 3 hours  ° °We recommend that you follow this schedule around-the-clock for at least 3 days after surgery, or until you feel that it is no longer needed. Use the table on the last page of this handout to keep track of the medications you are taking. °Important: °Do not take more than 3000mg of Tylenol or 3200mg of Motrin in a 24-hour period. °Do not take ibuprofen/Motrin if you have a history of bleeding stomach ulcers, severe kidney disease, &/or actively taking a blood thinner ° °What if I still have pain? °If you have pain that is not controlled with the over-the-counter pain medications (Tylenol and Motrin or Advil) you might have what we call “breakthrough” pain. You will receive a prescription for a small amount of an opioid pain medication such as Oxycodone, Tramadol, or Tylenol with Codeine. Use these opioid pills in the first 24 hours after surgery if you have breakthrough pain. Do not take more than 1 pill every 4-6 hours. ° °If you still have uncontrolled pain after using all opioid pills, don't hesitate to call our staff using the number provided. We will help make sure you are managing your pain in the best way possible, and if necessary, we can provide a prescription for additional pain medication. ° ° °Day 1   ° °Time  °Name of Medication Number of pills taken  °Amount of Acetaminophen  °Pain Level  ° °Comments  °AM PM       °AM PM       °AM PM       °  AM PM       °AM PM       °AM PM       °AM PM       °AM PM       °Total Daily amount of Acetaminophen °Do not take more than  3,000 mg per day    ° ° °Day 2   ° °Time  °Name of Medication Number of pills °taken  °Amount of Acetaminophen  °Pain Level  ° °Comments  °AM PM       °AM PM       °AM PM       °AM PM       °AM PM       °AM PM       °AM PM       °AM PM       °Total Daily amount of Acetaminophen °Do not take more than  3,000 mg per day    ° ° °Day 3   ° °Time  °Name of Medication Number of pills taken    °Amount of Acetaminophen  °Pain Level  ° °Comments  °AM PM       °AM PM       °AM PM       °AM PM       ° ° ° °AM PM       °AM PM       °AM PM       °AM PM       °Total Daily amount of Acetaminophen °Do not take more than  3,000 mg per day    ° ° °Day 4   ° °Time  °Name of Medication Number of pills taken  °Amount of Acetaminophen  °Pain Level  ° °Comments  °AM PM       °AM PM       °AM PM       °AM PM       °AM PM       °AM PM       °AM PM       °AM PM       °Total Daily amount of Acetaminophen °Do not take more than  3,000 mg per day    ° ° °Day 5   ° °Time  °Name of Medication Number °of pills taken  °Amount of Acetaminophen  °Pain Level  ° °Comments  °AM PM       °AM PM       °AM PM       °AM PM       °AM PM       °AM PM       °AM PM       °AM PM       °Total Daily amount of Acetaminophen °Do not take more than  3,000 mg per day    ° ° ° °Day 6   ° °Time  °Name of Medication Number of pills °taken  °Amount of Acetaminophen  °Pain Level  °Comments  °AM PM       °AM PM       °AM PM       °AM PM       °AM PM       °AM PM       °AM PM       °AM PM       °Total Daily amount of Acetaminophen °Do not take more than    3,000 mg per day    ° ° °Day 7   ° °Time  °Name of Medication Number of pills taken  °Amount of Acetaminophen  °Pain Level  ° °Comments  °AM PM       °AM PM       °AM PM       °AM PM       °AM PM       °AM PM       °AM PM       °AM PM       °Total Daily amount of Acetaminophen °Do not take more than  3,000 mg per day    ° ° ° ° °For additional information about how and where to safely dispose of unused opioid °medications - https://www.morepowerfulnc.org ° °Disclaimer: This document contains information and/or instructional materials adapted from [facility] for the typical patient with your condition. It does not replace medical advice from your health care provider because your experience may differ from that of the °typical patient. Talk to your health care provider if you have any questions about  this °document, your condition or your treatment plan. °Adapted from [facility] ° °

## 2019-04-16 NOTE — Anesthesia Postprocedure Evaluation (Signed)
Anesthesia Post Note  Patient: Kendra Jennings  Procedure(s) Performed: LEFT BREAST LUMPECTOMY WITH RADIOACTIVE SEED LOCALIZATION (Left Breast)     Patient location during evaluation: PACU Anesthesia Type: General Level of consciousness: awake and alert Pain management: pain level controlled Vital Signs Assessment: post-procedure vital signs reviewed and stable Respiratory status: spontaneous breathing, nonlabored ventilation and respiratory function stable Cardiovascular status: blood pressure returned to baseline and stable Postop Assessment: no apparent nausea or vomiting Anesthetic complications: no    Last Vitals:  Vitals:   04/16/19 1038 04/16/19 1045  BP: (!) 156/88 (!) 143/85  Pulse: 87 89  Resp: 20 20  Temp:    SpO2: 93% 93%    Last Pain:  Vitals:   04/16/19 1038  TempSrc:   PainSc: 4                  Carnisha Feltz,W. EDMOND

## 2019-04-16 NOTE — Op Note (Signed)
Patient Name:           Kendra Jennings   Date of Surgery:        04/16/2019  Pre op Diagnosis:      Left breast mass; discordant image guided biopsy  Post op Diagnosis:    Same  Procedure:                 Left breast lumpectomy with radioactive seed localization and margin assessment  Surgeon:                     Edsel Petrin. Dalbert Batman, M.D., FACS  Assistant:                      Or staff  Operative Indications:  This is a 63 year old female, referred by Dr. Jeanmarie Plant at the [facility] for discordant biopsy left breast. Dr. Andria Frames is her PCP. Dr. Chalmers Cater is her endocrinologist      She has no prior history of breast surgery or breast problems. She is felt a lump in the left breast for some time. This is probably 3 cm in size. Her mammograms and ultrasound show slight distortion and density in the central left breast under the palpable lump. The ultrasound is reportedly showing only a 4.4 mm mass with vascularity. They question abnormal left axillary lymph node.  Physical exam confirms a 3 cm mass at the left breast, lateral areolar margin without any evidence of skin distortion. Image guided biopsy of the lymph node was completely benign and it is felt to be concordant. 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 She was referred for excision she is certainly interested in having that done Family history is negative for breast or ovarian cancer. Mother died of lung cancer. Father died of a stroke.     We had a long discussion about differential diagnosis. Technique. Risks. She wants to go ahead and have this done. She'll be scheduled for left breast lumpectomy with radioactive seed localization.  She agrees with this plan.   Operative Findings:       There was a 3 cm somewhat mobile palpable mass in the left breast at the 3 o'clock position partly subareolar and partly lateral to the areolar margin.  The nipple and areola and breast skin look normal.   The radioactive seed was present within this mass, perhaps in the anterior third.  The radioactive seed and the entire palpable mass were excised.  Specimen mammogram looked good containing the mass and the seed and the original biopsy clip.  Procedure in Detail:          Following the induction of general LMA anesthesia the patient's left breast was prepped and draped in a sterile fashion.  Surgical timeout was performed.  Intravenous antibiotics were given.  0.5% Marcaine with epinephrine was used as a local infiltration anesthetic.      The neoprobe was used to map out the seed and the mass.  I chose a circumareolar, hidden scar technique and made a circumareolar incision at the lateral areolar margin with a knife.  The lumpectomy was performed using the neoprobe and electrocautery.  The specimen was removed and was marked with silk sutures and a 6 color ink kit to orient the pathologist.  The left breast mass was sent to the lab where the seed was retrieved.  Hemostasis was excellent.  The wound was irrigated.  5 metal marker  clips were placed in the walls of the lumpectomy cavity.  The lumpectomy wound was closed in several layers with interrupted 3-0 Vicryl sutures and the skin closed with subcuticular 4-0 Monocryl and Dermabond.  Dry bandages and  breast binder were placed.  The patient tolerated the procedure well was taken to PACU in stable condition.  EBL 15 cc.  Counts correct.  Complications none     Addendum: I logged onto the PMP aware website and reviewed her prescription medication history     Marcena Dias M. Dalbert Batman, M.D., FACS General and Minimally Invasive Surgery Breast and Colorectal Surgery  04/16/2019 10:06 AM

## 2019-04-16 NOTE — Transfer of Care (Signed)
Immediate Anesthesia Transfer of Care Note  Patient: Kendra Jennings  Procedure(s) Performed: LEFT BREAST LUMPECTOMY WITH RADIOACTIVE SEED LOCALIZATION (Left Breast)  Patient Location: PACU  Anesthesia Type:General  Level of Consciousness: alert , oriented, drowsy and patient cooperative  Airway & Oxygen Therapy: Patient Spontanous Breathing and Patient connected to face mask oxygen  Post-op Assessment: Report given to RN and Post -op Vital signs reviewed and stable  Post vital signs: Reviewed and stable  Last Vitals:  Vitals Value Taken Time  BP 143/96 04/16/19 1009  Temp    Pulse 98 04/16/19 1010  Resp 17 04/16/19 1010  SpO2 100 % 04/16/19 1010  Vitals shown include unvalidated device data.  Last Pain:  Vitals:   04/16/19 0716  TempSrc: Oral  PainSc: 0-No pain         Complications: No apparent anesthesia complications

## 2019-04-16 NOTE — Anesthesia Procedure Notes (Signed)
Procedure Name: LMA Insertion Date/Time: 04/16/2019 9:06 AM Performed by: Raenette Rover, CRNA Pre-anesthesia Checklist: Patient identified, Emergency Drugs available, Suction available, Patient being monitored and Timeout performed Patient Re-evaluated:Patient Re-evaluated prior to induction Oxygen Delivery Method: Circle system utilized Preoxygenation: Pre-oxygenation with 100% oxygen Induction Type: IV induction Ventilation: Mask ventilation without difficulty LMA: LMA inserted LMA Size: 4.0 Number of attempts: 1 Placement Confirmation: positive ETCO2,  CO2 detector and breath sounds checked- equal and bilateral Tube secured with: Tape Dental Injury: Teeth and Oropharynx as per pre-operative assessment

## 2019-04-16 NOTE — Interval H&P Note (Signed)
History and Physical Interval Note:  04/16/2019 8:25 AM  Kendra Jennings  has presented today for surgery, with the diagnosis of LEFT BREAST MASS.  The various methods of treatment have been discussed with the patient and family. After consideration of risks, benefits and other options for treatment, the patient has consented to  Procedure(s): LEFT BREAST LUMPECTOMY WITH RADIOACTIVE SEED LOCALIZATION (Left) as a surgical intervention.  The patient's history has been reviewed, patient examined, no change in status, stable for surgery.  I have reviewed the patient's chart and labs.  Questions were answered to the patient's satisfaction.     Adin Hector

## 2019-04-17 ENCOUNTER — Encounter (HOSPITAL_COMMUNITY): Payer: Self-pay | Admitting: General Surgery

## 2019-04-18 NOTE — Progress Notes (Signed)
Inform patient of Pathology report,. Breast pathology is benign.  There is no evidence of cancer. I will discuss this with her in detail at her next office visit Let me know that you contacted her  Dalbert Batman

## 2019-12-12 ENCOUNTER — Ambulatory Visit: Payer: 59 | Attending: Internal Medicine

## 2019-12-12 DIAGNOSIS — Z23 Encounter for immunization: Secondary | ICD-10-CM

## 2019-12-12 NOTE — Progress Notes (Signed)
   Covid-19 Vaccination Clinic  Name:  Kendra Jennings    MRN: [id number] DOB: 1955/12/07  12/12/2019  Ms. [name] was observed post Covid-19 immunization for 15 minutes without incident. She was provided with Vaccine Information Sheet and instruction to access the V-Safe system.   Ms. [name] was instructed to call 911 with any severe reactions post vaccine: Marland Kitchen Difficulty breathing  . Swelling of face and throat  . A fast heartbeat  . A bad rash all over body  . Dizziness and weakness   Immunizations Administered    Name Date Dose VIS Date Route   Moderna COVID-19 Vaccine 12/12/2019  1:00 PM 0.5 mL 08/27/2019 Intramuscular   Manufacturer: Moderna   Lot: [id number]   NDC: 02334-356-86

## 2019-12-13 ENCOUNTER — Other Ambulatory Visit: Payer: Self-pay | Admitting: Family Medicine

## 2019-12-13 DIAGNOSIS — Z1231 Encounter for screening mammogram for malignant neoplasm of breast: Secondary | ICD-10-CM

## 2020-01-01 ENCOUNTER — Ambulatory Visit
Admission: RE | Admit: 2020-01-01 | Discharge: 2020-01-01 | Disposition: A | Discharge status: Home / Self Care | Payer: 59 | Source: Ambulatory Visit | Attending: Family Medicine | Admitting: Family Medicine

## 2020-01-01 ENCOUNTER — Other Ambulatory Visit: Payer: Self-pay

## 2020-01-01 DIAGNOSIS — Z1231 Encounter for screening mammogram for malignant neoplasm of breast: Secondary | ICD-10-CM

## 2020-01-14 ENCOUNTER — Ambulatory Visit: Payer: 59 | Attending: Family

## 2020-01-14 DIAGNOSIS — Z23 Encounter for immunization: Secondary | ICD-10-CM

## 2020-01-14 NOTE — Progress Notes (Signed)
   Covid-19 Vaccination Clinic  Name:  Kendra Jennings    MRN: [id number] DOB: 06-28-1956  01/14/2020  [name] was observed post Covid-19 immunization for 15 minutes without incident. She was provided with Vaccine Information Sheet and instruction to access the V-Safe system.   [name] was instructed to call 911 with any severe reactions post vaccine: Marland Kitchen Difficulty breathing  . Swelling of face and throat  . A fast heartbeat  . A bad rash all over body  . Dizziness and weakness   Immunizations Administered    Name Date Dose VIS Date Route   Moderna COVID-19 Vaccine 01/14/2020  3:07 PM 0.5 mL 08/2019 Intramuscular   Manufacturer: Moderna   Lot: [id number]   NDC: 41423-953-20

## 2020-06-17 IMAGING — MG NEEDLE LOCALIZATION OF THE LEFT BREAST WITH MAMMO GUIDANCE
6 series · 6 of 6 positions shown · non-contrast
Comparison: Previous exam(s).

CLINICAL DATA: Patient presents for radioactive seed localization
of a left breast mass prior to surgical excision.

EXAM:
MAMMOGRAPHIC GUIDED RADIOACTIVE SEED LOCALIZATION OF THE LEFT BREAST

[L CC (1 of 3)]
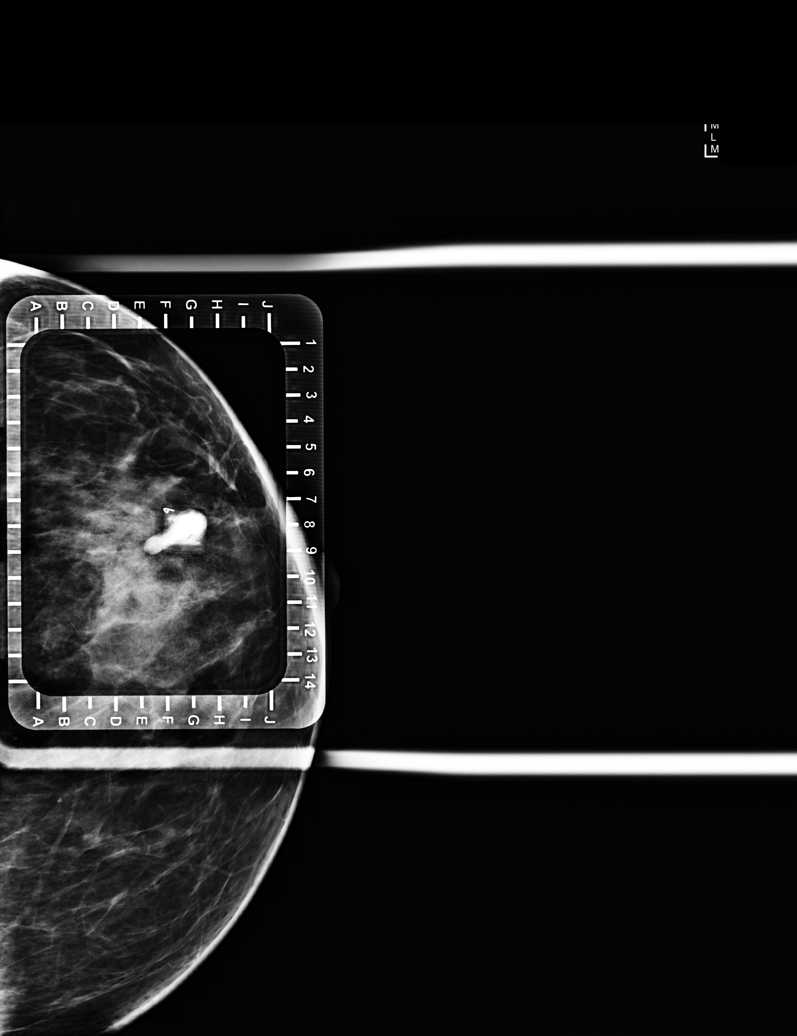

[L ML (1 of 3)]
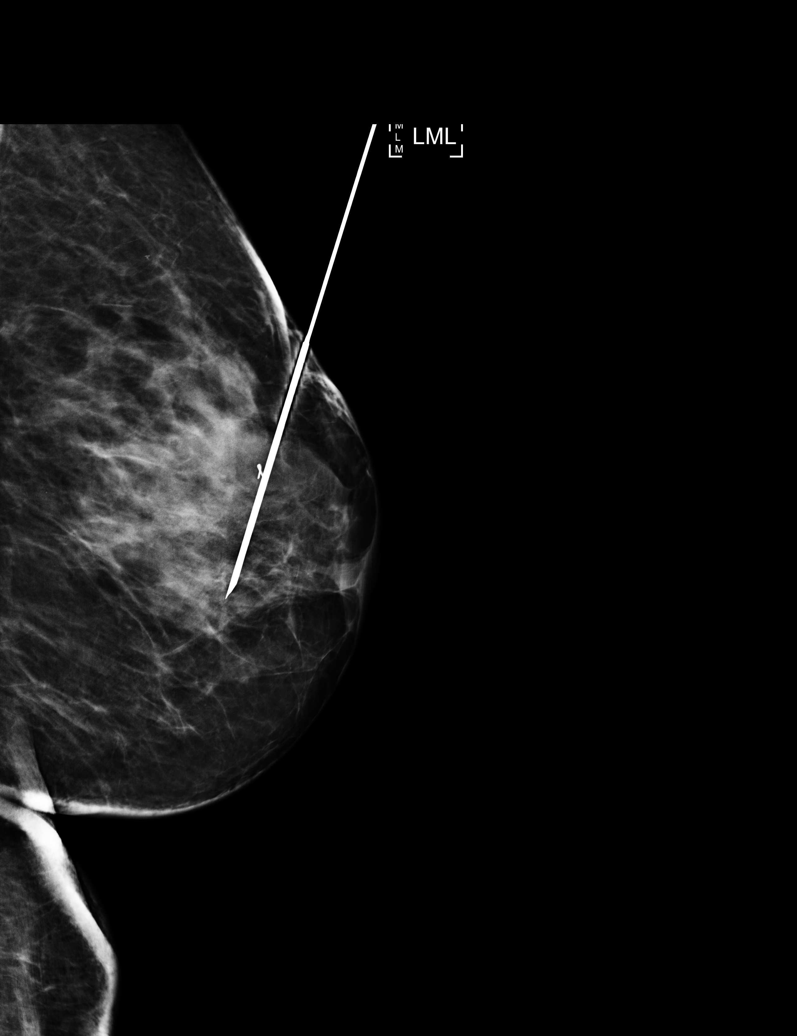

[L CC (2 of 3)]
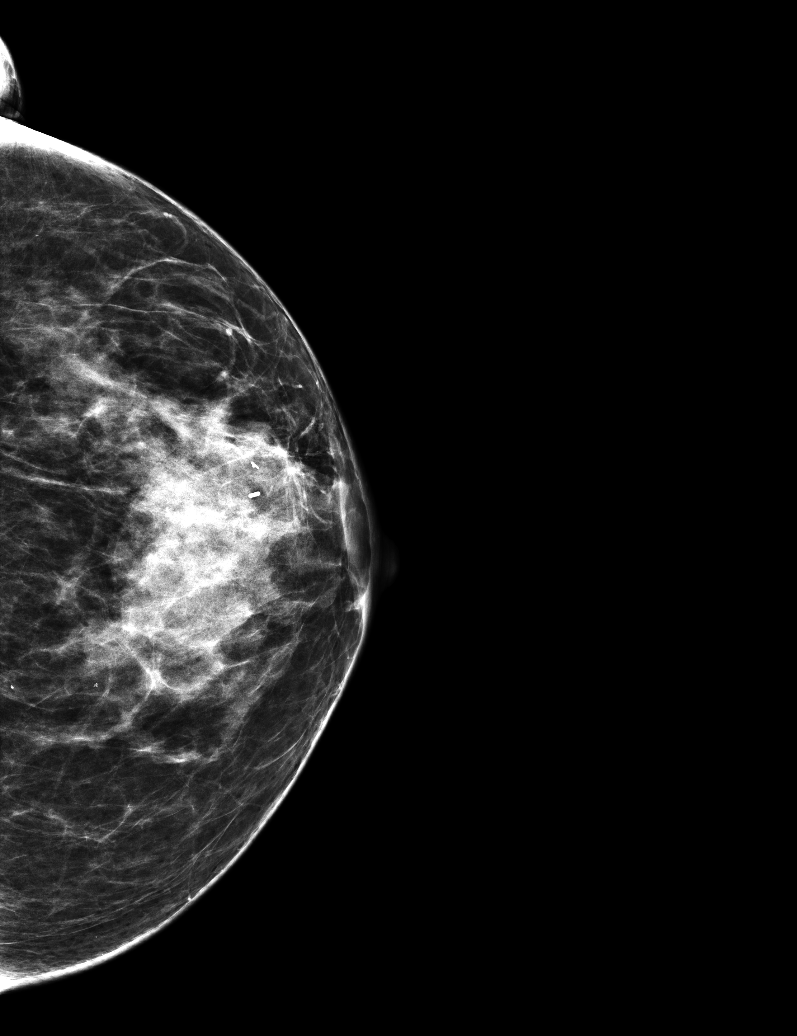

[L ML (2 of 3)]
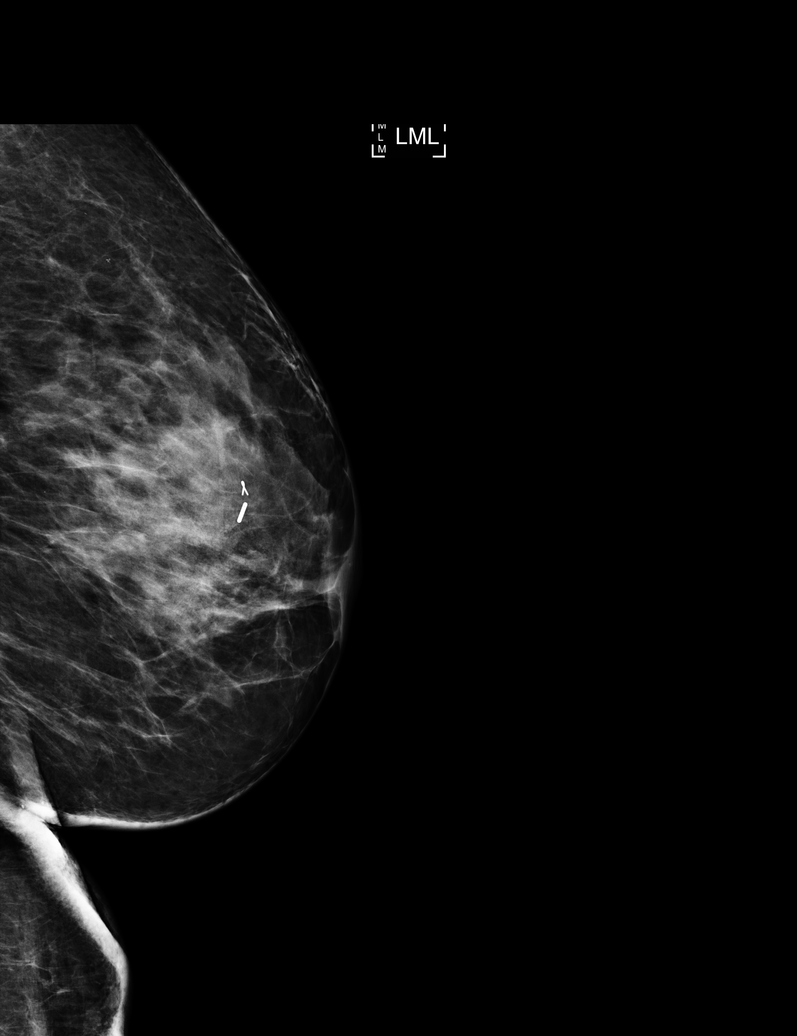

[L ML (3 of 3)]
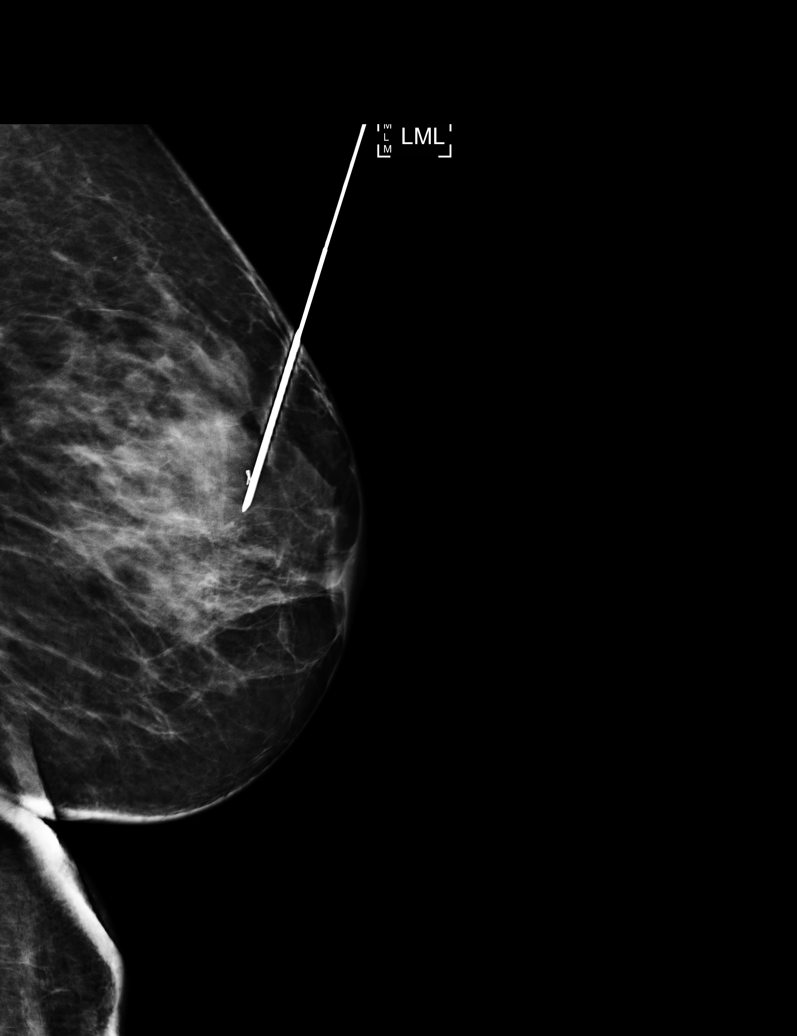

[L CC (3 of 3)]
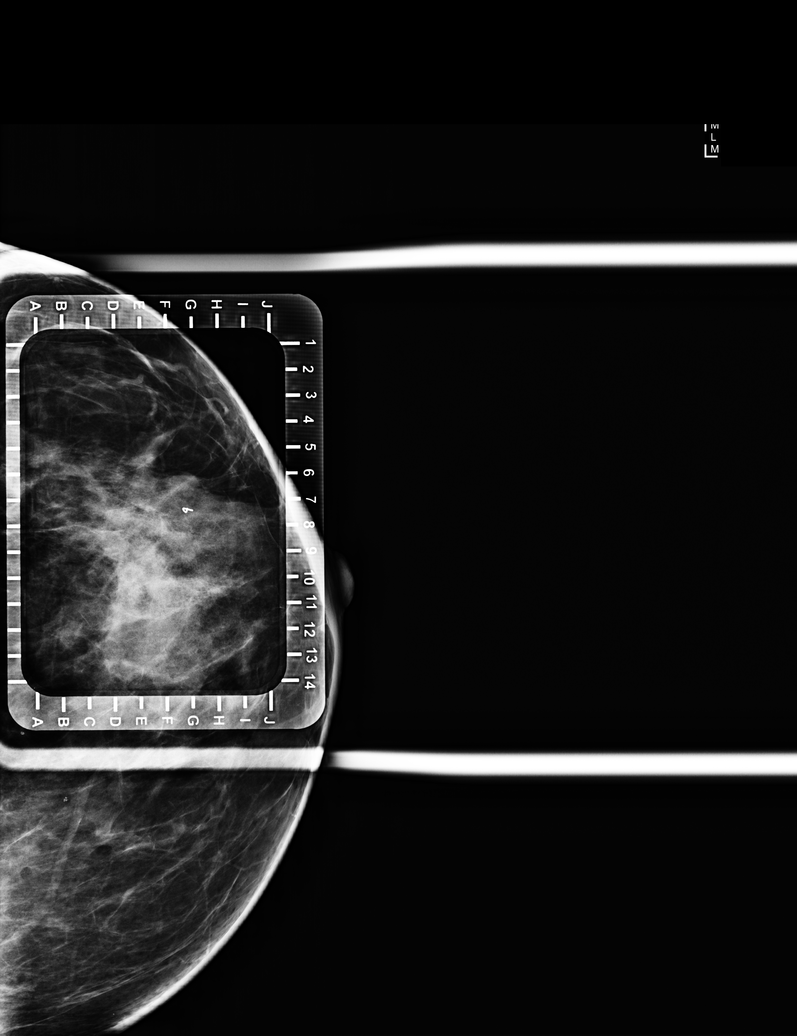

[6 of 6 positions shown; findings below may reference images not displayed]

FINDINGS: Patient presents for radioactive seed localization prior to surgical
excision. I met with the patient and we discussed the procedure of
seed localization including benefits and alternatives. We discussed
the high likelihood of a successful procedure. We discussed the
risks of the procedure including infection, bleeding, tissue injury
and further surgery. We discussed the low dose of radioactivity
involved in the procedure. Informed, written consent was given.

The usual time-out protocol was performed immediately prior to the
procedure.

Using mammographic guidance, sterile technique, 1% lidocaine and an
R-HKL radioactive seed, the mass, localized with a ribbon shaped
biopsy clip, was localized using a superior approach. The follow-up
mammogram images confirm the seed in the expected location and were
marked for Dr. Tuyet.

Follow-up survey of the patient confirms presence of the radioactive
seed.

Order number of R-HKL seed:  [id number].

Total activity:  0.250 reference Date: 03/20/2019

The patient tolerated the procedure well and was released from the
[REDACTED]. She was given instructions regarding seed removal.
IMPRESSION: Radioactive seed localization the left breast. No apparent
complications.

## 2021-02-08 ENCOUNTER — Other Ambulatory Visit: Payer: Self-pay | Admitting: Family Medicine

## 2021-02-08 DIAGNOSIS — Z1231 Encounter for screening mammogram for malignant neoplasm of breast: Secondary | ICD-10-CM

## 2021-02-17 ENCOUNTER — Ambulatory Visit
Admission: RE | Admit: 2021-02-17 | Discharge: 2021-02-17 | Disposition: A | Discharge status: Home / Self Care | Payer: 59 | Source: Ambulatory Visit | Attending: Family Medicine | Admitting: Family Medicine

## 2021-02-17 ENCOUNTER — Other Ambulatory Visit: Payer: Self-pay

## 2021-02-17 DIAGNOSIS — Z1231 Encounter for screening mammogram for malignant neoplasm of breast: Secondary | ICD-10-CM

## 2022-03-03 ENCOUNTER — Other Ambulatory Visit: Payer: Self-pay | Admitting: Family Medicine

## 2022-03-03 DIAGNOSIS — Z1231 Encounter for screening mammogram for malignant neoplasm of breast: Secondary | ICD-10-CM

## 2022-03-15 ENCOUNTER — Ambulatory Visit
Admission: RE | Admit: 2022-03-15 | Discharge: 2022-03-15 | Disposition: A | Discharge status: Home / Self Care | Payer: 59 | Source: Ambulatory Visit | Attending: Family Medicine | Admitting: Family Medicine

## 2022-03-15 DIAGNOSIS — Z1231 Encounter for screening mammogram for malignant neoplasm of breast: Secondary | ICD-10-CM

## 2022-03-18 ENCOUNTER — Other Ambulatory Visit: Payer: Self-pay | Admitting: Family Medicine

## 2022-03-18 DIAGNOSIS — R928 Other abnormal and inconclusive findings on diagnostic imaging of breast: Secondary | ICD-10-CM

## 2022-03-30 ENCOUNTER — Ambulatory Visit
Admission: RE | Admit: 2022-03-30 | Discharge: 2022-03-30 | Disposition: A | Discharge status: Home / Self Care | Payer: 59 | Source: Ambulatory Visit | Attending: Family Medicine | Admitting: Family Medicine

## 2022-03-30 DIAGNOSIS — R928 Other abnormal and inconclusive findings on diagnostic imaging of breast: Secondary | ICD-10-CM

## 2023-02-16 ENCOUNTER — Other Ambulatory Visit: Payer: Self-pay | Admitting: Family Medicine

## 2023-02-16 DIAGNOSIS — Z1231 Encounter for screening mammogram for malignant neoplasm of breast: Secondary | ICD-10-CM

## 2023-03-21 ENCOUNTER — Ambulatory Visit
Admission: RE | Admit: 2023-03-21 | Discharge: 2023-03-21 | Disposition: A | Discharge status: Home / Self Care | Payer: 59 | Source: Ambulatory Visit | Attending: Family Medicine | Admitting: Family Medicine

## 2023-03-21 DIAGNOSIS — Z1231 Encounter for screening mammogram for malignant neoplasm of breast: Secondary | ICD-10-CM

## 2024-03-21 ENCOUNTER — Other Ambulatory Visit: Payer: Self-pay | Admitting: Family Medicine

## 2024-03-21 DIAGNOSIS — Z1231 Encounter for screening mammogram for malignant neoplasm of breast: Secondary | ICD-10-CM

## 2024-03-22 ENCOUNTER — Ambulatory Visit
Admission: RE | Admit: 2024-03-22 | Discharge: 2024-03-22 | Disposition: A | Discharge status: Home / Self Care | Source: Ambulatory Visit | Attending: Family Medicine | Admitting: Family Medicine

## 2024-03-22 DIAGNOSIS — Z1231 Encounter for screening mammogram for malignant neoplasm of breast: Secondary | ICD-10-CM

## 2024-03-27 ENCOUNTER — Ambulatory Visit
